# Patient Record
Sex: Female | Born: 1975 | Race: White | Hispanic: No | Marital: Married | Smoking: Never smoker
Health system: Southern US, Community
[De-identification: ages and names within clinical notes are randomized; demographics above are authoritative.]

## PROBLEM LIST (undated history)

## (undated) DIAGNOSIS — E039 Hypothyroidism, unspecified: Secondary | ICD-10-CM

## (undated) DIAGNOSIS — Z808 Family history of malignant neoplasm of other organs or systems: Secondary | ICD-10-CM

## (undated) DIAGNOSIS — Z803 Family history of malignant neoplasm of breast: Secondary | ICD-10-CM

## (undated) DIAGNOSIS — Z800 Family history of malignant neoplasm of digestive organs: Secondary | ICD-10-CM

## (undated) DIAGNOSIS — D649 Anemia, unspecified: Secondary | ICD-10-CM

## (undated) HISTORY — DX: Family history of malignant neoplasm of breast: Z80.3

## (undated) HISTORY — DX: Hypothyroidism, unspecified: E03.9

## (undated) HISTORY — DX: Family history of malignant neoplasm of digestive organs: Z80.0

## (undated) HISTORY — DX: Family history of malignant neoplasm of other organs or systems: Z80.8

## (undated) HISTORY — DX: Anemia, unspecified: D64.9

---

## 2003-06-21 ENCOUNTER — Inpatient Hospital Stay (HOSPITAL_COMMUNITY): Admission: AD | Admit: 2003-06-21 | Discharge: 2003-06-21 | Payer: Self-pay | Admitting: *Deleted

## 2004-12-01 ENCOUNTER — Other Ambulatory Visit: Admission: RE | Admit: 2004-12-01 | Discharge: 2004-12-01 | Payer: Self-pay | Admitting: Obstetrics and Gynecology

## 2005-06-16 ENCOUNTER — Inpatient Hospital Stay (HOSPITAL_COMMUNITY): Admission: AD | Admit: 2005-06-16 | Discharge: 2005-06-18 | Payer: Self-pay | Admitting: Obstetrics and Gynecology

## 2005-08-03 ENCOUNTER — Other Ambulatory Visit: Admission: RE | Admit: 2005-08-03 | Discharge: 2005-08-03 | Payer: Self-pay | Admitting: Obstetrics and Gynecology

## 2008-04-03 ENCOUNTER — Ambulatory Visit (HOSPITAL_COMMUNITY): Admission: RE | Admit: 2008-04-03 | Discharge: 2008-04-03 | Payer: Self-pay | Admitting: Obstetrics and Gynecology

## 2008-04-03 IMAGING — RF DG HYSTEROGRAM
2 series · 2 of 2 positions shown · non-contrast
Comparison: none

CLINICAL DATA: Evaluate tubal patency

HYSTEROSALPINGOGRAM
TECHNIQUE: Hysterosalpingogram was performed by the ordering
physician under fluoroscopy.  Fluoroscopic images are submitted for
interpretation following the procedure.
Fluoroscopy time:  1.4 minutes

[Series 1: run · 1 of 1 slices shown (1 of 2)]
[im 1/1]
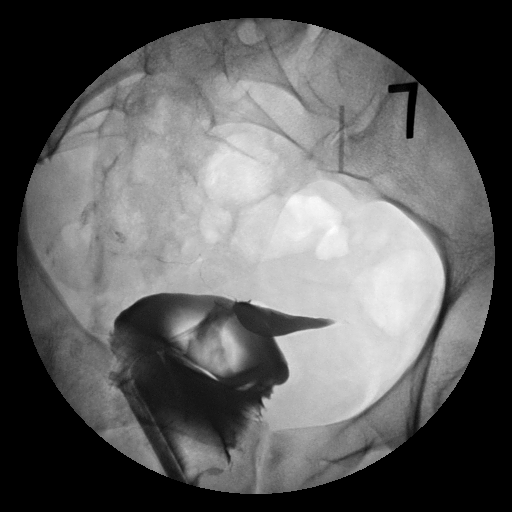

[Series 2: run · 1 of 1 slices shown (2 of 2)]
[im 1/1]
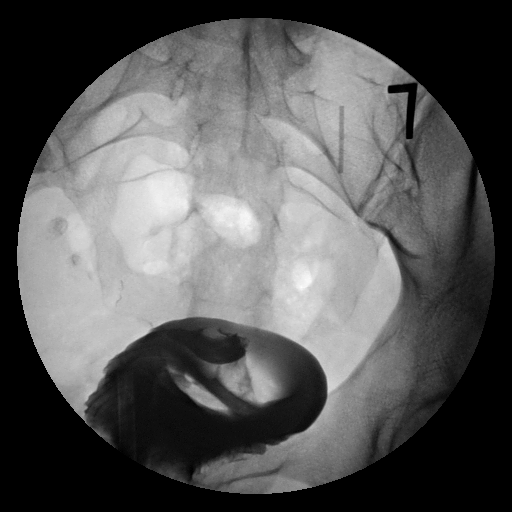

[2 of 2 positions shown; findings below may reference images not displayed]

FINDINGS: Two images are submitted for interpretation.  No
definite focal endometrial abnormalities are identified on the
provided images.  The right fallopian tube has a normal morphology.
A small amount of intraperitoneal spill is suggested on this side
although it is minimal in amount and precludes evaluation for the
possibility of peritubal or periovarian adhesions.  The left
fallopian tube is not seen past the level of the cornual-tubal
junction.  This may be the result of spasm, occlusion or possibly
an inadequate amount of pressure to fill the tube given the large
amount of contrast identified within the vaginal vault suggesting a
significant back leakage during injection.
IMPRESSION: No focal endometrial abnormality.  Probable right tubal patency.
Non-visualized left fallopian tube.  Please see above report for
complete discussion.

## 2008-04-23 ENCOUNTER — Encounter: Admission: RE | Admit: 2008-04-23 | Discharge: 2008-04-23 | Payer: Self-pay | Admitting: Obstetrics and Gynecology

## 2008-05-05 ENCOUNTER — Ambulatory Visit (HOSPITAL_COMMUNITY): Admission: RE | Admit: 2008-05-05 | Discharge: 2008-05-05 | Payer: Self-pay | Admitting: Obstetrics and Gynecology

## 2008-05-05 IMAGING — XA DG HYSTEROGRAM
1 series · 7 of 7 positions shown · non-contrast
Comparison: none

EXAMINATION:

LEFT FALLOPIAN TUBE RECANALIZATION
HYSTEROSALPINGOGRAM
CLINICAL DATA: 32-year-old female infertility evaluation, previous
HSG demonstrated left fallopian tubal occlusion.  Right fallopian
tube was visualized and patent at that time.

[Series 1000: run · 0.08mm/px · 7 of 7 slices shown]
[im 1/7]
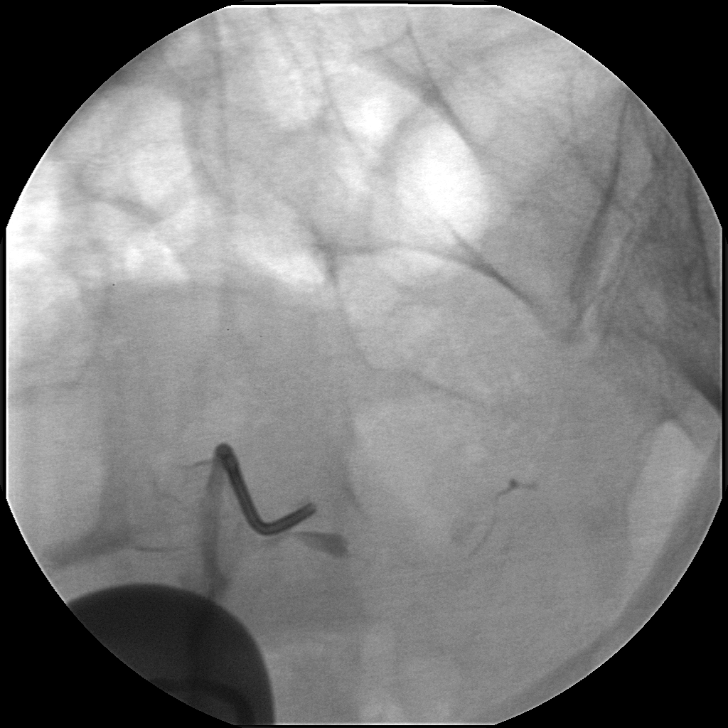
[im 2/7]
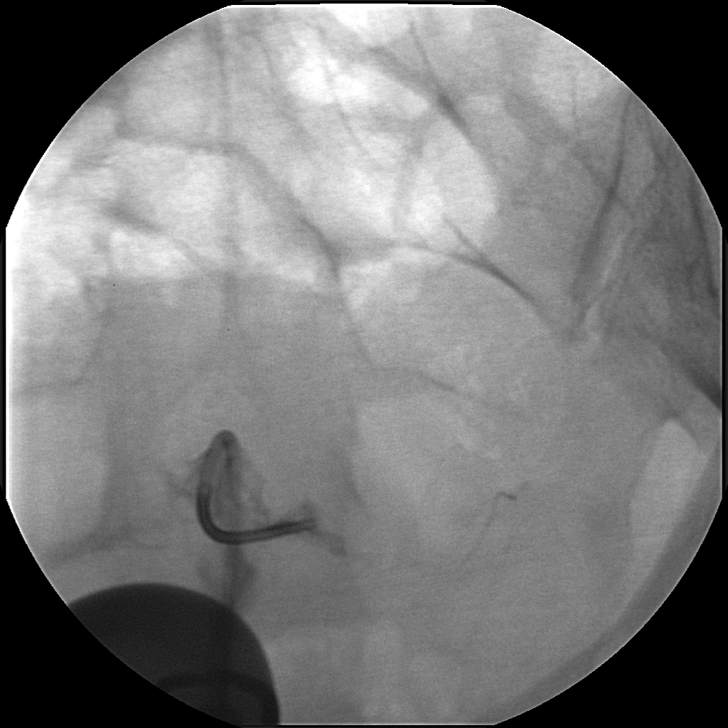
[im 3/7]
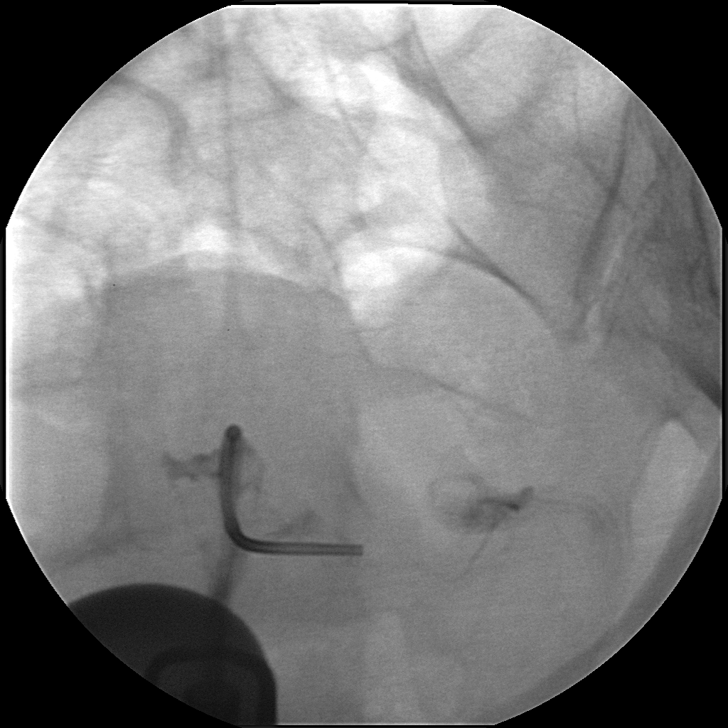
[im 4/7]
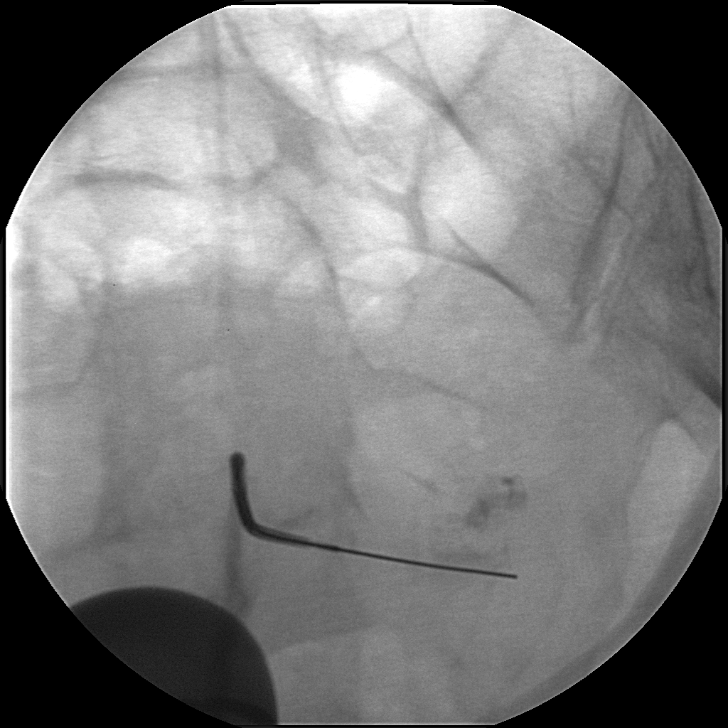
[im 5/7]
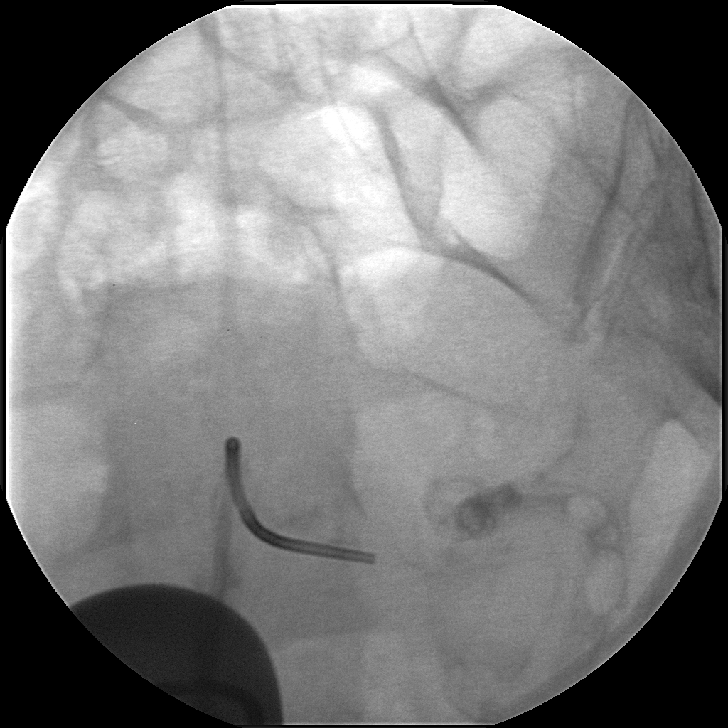
[im 6/7]
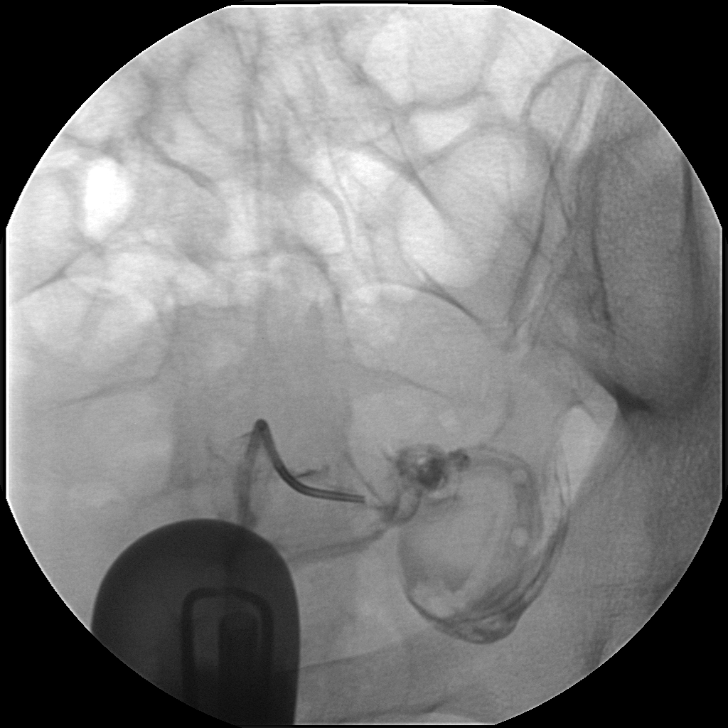
[im 7/7]
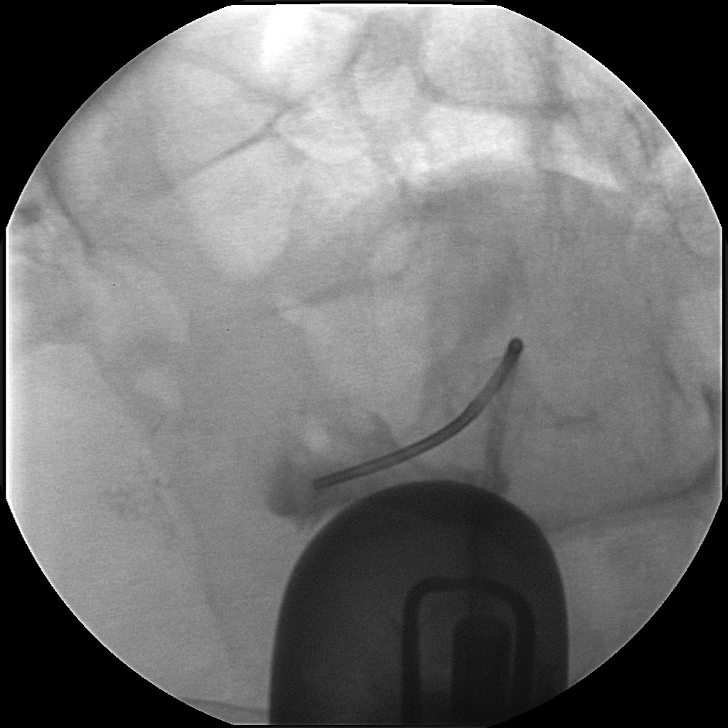

[7 of 7 positions shown; findings below may reference images not displayed]

Date:[DATE] [DATE]

Radiologist:DAIMUI.DAIMUI, M.D.

Medications:  1 gram Ancef  intravenous

Guidance:Fluoroscopic

Fluoroscopy time:13 minutes

Contrast volume:100 ml [JA]

Complications:No immediate

PROCEDURE/FINDINGS:

Informed consent was obtained from the patient following
explanation of the procedure, risks, benefits and alternatives.
The patient understands, agrees and consents for the procedure.
All questions were addressed.  A time out was performed.

The previous HSG from [DATE] [JA] was reviewed.

The perineal region was sterilely prepped and draped.  The speculum
was advanced into the vaginal canal and the cervix was secured.
The cervical os was prepped with betadine.  The fallopian tube
recanalization outer cannula was advanced into the cervical os and
secured with an external tenaculum.  A 5-French Kumpe catheter over
a guide wire was advanced through the endocervical canal  into the
intrauterine cavity.  Contrast injection was performed for a HSG.

The catheter and guidewire were manipulated to the left fallopian
tube orifice.  The micro catheter and micro guide wire were easily
advanced into the proximal one third of the left fallopian tube.
Repeat contrast injection confirms patency of the left fallopian
tube with peritoneal spillage identified.  Images were obtained for
documentation.

Attempts were made to reconfirm right fallopian tube patency.
However, because of angulation, and difficulty distending the
intrauterine cavity imaging of the right fallopian tube was very
limited.  There is faint visualization of contrast within the right
fallopian tube distally confirming patency.  The right fallopian
tube was better visualized on the [DATE] exam.

The patient tolerated the procedure well.  No immediate
complication.  Catheter, guide wires and speculum were removed.
IMPRESSION: Successful left fallopian tube recanalization confirming
patency with peritoneal spillage.
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
## 2011-03-08 LAB — CBC
MCV: 86.1
RDW: 13.5
WBC: 4.8

## 2011-03-08 LAB — HCG, SERUM, QUALITATIVE: Preg, Serum: NEGATIVE

## 2015-07-01 DIAGNOSIS — N920 Excessive and frequent menstruation with regular cycle: Secondary | ICD-10-CM | POA: Insufficient documentation

## 2015-07-01 DIAGNOSIS — D251 Intramural leiomyoma of uterus: Secondary | ICD-10-CM | POA: Insufficient documentation

## 2016-06-06 HISTORY — PX: UTERINE FIBROID SURGERY: SHX826

## 2018-11-08 ENCOUNTER — Other Ambulatory Visit: Payer: Self-pay | Admitting: Genetic Counselor

## 2018-11-08 DIAGNOSIS — Z803 Family history of malignant neoplasm of breast: Secondary | ICD-10-CM

## 2018-11-12 ENCOUNTER — Telehealth: Payer: Self-pay | Admitting: Genetic Counselor

## 2018-11-12 ENCOUNTER — Inpatient Hospital Stay: Payer: Self-pay

## 2018-11-12 NOTE — Telephone Encounter (Signed)
Called patient regarding upcoming Webex appointment, patient does not want this to be a virtual visit. Patient will need this to be a walk-in visit.

## 2018-11-13 ENCOUNTER — Other Ambulatory Visit: Payer: Self-pay

## 2018-11-13 ENCOUNTER — Inpatient Hospital Stay: Payer: BC Managed Care – PPO

## 2018-11-13 ENCOUNTER — Encounter: Payer: Self-pay | Admitting: Genetic Counselor

## 2018-11-13 ENCOUNTER — Inpatient Hospital Stay: Payer: BC Managed Care – PPO | Attending: Genetic Counselor | Admitting: Genetic Counselor

## 2018-11-13 DIAGNOSIS — Z800 Family history of malignant neoplasm of digestive organs: Secondary | ICD-10-CM

## 2018-11-13 DIAGNOSIS — Z7183 Encounter for nonprocreative genetic counseling: Secondary | ICD-10-CM

## 2018-11-13 DIAGNOSIS — Z808 Family history of malignant neoplasm of other organs or systems: Secondary | ICD-10-CM

## 2018-11-13 DIAGNOSIS — Z803 Family history of malignant neoplasm of breast: Secondary | ICD-10-CM

## 2018-11-13 NOTE — Progress Notes (Signed)
REFERRING PROVIDER: No referring provider defined for this encounter.  PRIMARY PROVIDER:  Jonathon Jordan, MD  PRIMARY REASON FOR VISIT:  1. Family history of breast cancer   2. Family history of colon cancer   3. Family history of melanoma      HISTORY OF PRESENT ILLNESS:   Suzanne Kerr, a 43 y.o. female, was seen for a [place] cancer genetics consultation at the request of Dr. No ref. provider found due to a family history of cancer and a known family mutation in [place].  Suzanne Kerr presents to clinic today to discuss the possibility of a hereditary predisposition to cancer, genetic testing, and to further clarify her future cancer risks, as well as potential cancer risks for family members.   Suzanne Kerr is a 43 y.o. female with no personal history of cancer.  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  CANCER HISTORY:   No history exists.     RISK FACTORS:  Menarche was at age 74-15.  First live birth at age 59.  OCP use for approximately 0 years.  Ovaries intact: yes.  Hysterectomy: no.  Menopausal status: premenopausal.  HRT use: 0 years. Colonoscopy: no; not examined. Mammogram within the last year: yes. Number of breast biopsies: 0. Up to date with pelvic exams: yes. Any excessive radiation exposure in the past: no  Past Medical History:  Diagnosis Date  . Family history of breast cancer   . Family history of colon cancer   . Family history of melanoma       Social History   Socioeconomic History  . Marital status: Married    Spouse name: Not on file  . Number of children: Not on file  . Years of education: Not on file  . Highest education level: Not on file  Occupational History  . Not on file  Social Needs  . Financial resource strain: Not on file  . Food insecurity:    Worry: Not on file    Inability: Not on file  . Transportation needs:    Medical: Not on file    Non-medical: Not on file  Tobacco Use  . Smoking status: Not on file   Substance and Sexual Activity  . Alcohol use: Not on file  . Drug use: Not on file  . Sexual activity: Not on file  Lifestyle  . Physical activity:    Days per week: Not on file    Minutes per session: Not on file  . Stress: Not on file  Relationships  . Social connections:    Talks on phone: Not on file    Gets together: Not on file    Attends religious service: Not on file    Active member of club or organization: Not on file    Attends meetings of clubs or organizations: Not on file    Relationship status: Not on file  Other Topics Concern  . Not on file  Social History Narrative  . Not on file     FAMILY HISTORY:  We obtained a detailed, 4-generation family history.  Significant diagnoses are listed below: Family History  Problem Relation Age of Onset  . Breast cancer Mother 27  . Heart disease Father   . Breast cancer Sister 60       PALB2+  . Colon cancer Sister 61  . Thyroid cancer Sister        early 32s  . Stroke Maternal Grandmother   . Melanoma Maternal  Grandfather   . Heart disease Paternal Grandfather   . Breast cancer Other        MGMs sister; post-menopausal cancer    The patient has one daughter who is cancer free. She has a brother who is cancer free.  Her sister had breast cancer at 38, colon cancer at 58 and probable thyroid cancer in her early 77's.  Both parents are deceased.  The patient's father died of heart disease.  He was an only child.  His parents are deceased.  His mother died at 1 from old age, and his father died of heart disease.  The patient's mother had breast cancer in her late 65's and died at 35 from a recurrence.  She has two brothers who are cancer free.  Her parents are deceased.  Her father had melanoma and her mother died of a stroke.  She had 8 sisters, one had breast cancer post-menopause.  Suzanne Kerr is aware of previous family history of genetic testing for hereditary cancer risks. Patient's maternal ancestors are of  [place] descent, and paternal ancestors are of [place] descent. There is no reported Ashkenazi Jewish ancestry. There is no known consanguinity.  GENETIC COUNSELING ASSESSMENT: Suzanne Kerr is a 43 y.o. female with a family history of cancer which is somewhat suggestive of a hereditary cancer syndrome and predisposition to cancer. We, therefore, discussed and recommended the following at today's visit.   DISCUSSION: We discussed that 5 - 10% of breast cancer is hereditary, with most cases associated with BRCA mutations.  Her sister had genetic testing for BRCA mutations when she was initially diagnosed and was negative.  There are other genes that can be associated with hereditary breast cancer syndromes.  These include PALB2, which her sister was found to have a pathogenic mutation in called c.3362del (p.Gly1121Valfs*3).   She also was found to have a variant of unknown significance (VUS) in WT1.  We discussed PALB2, the family history of colon cancer, and her reported CF mutation identified several years ago.  Clinical condition The risk of breast cancer in women with a single pathogenic PALB2 variant is 25-58% by age 64, with higher risks among those with a greater number of relatives with breast cancer (PMID: 83419622, 29798921, 19417408, 14481856). One study found the risk of developing contralateral breast cancer is approximately 10% within five years after the initial diagnosis of breast cancer among individuals with a pathogenic variant in [place] (PMID: 31497026).  For both men and women, there is also an increased risk for pancreatic cancer, however, specific risk figures are not yet established (PMID: 37858850, 27741287, 86767209). Additional data suggests an increased risk of ovarian cancer (PMID: 47096283, 66294765) and female breast cancer (PMID: 46503546, 56812751, 70017494), although this evidence is limited and emerging.  Inheritance Hereditary predisposition to cancer due to pathogenic variants  in the PALB2 gene has autosomal dominant inheritance. This means that Suzanne Kerr has a 50% chance of having the PALB2 mutation found in her sister. Once a pathogenic mutation is detected in an individual, it is possible to identify at-risk relatives who can pursue testing for this specific familial variant. Many cases are inherited from a parent, but some cases may occur spontaneously (i.e., an individual with a pathogenic variant who has parents who do not have it).  Individuals with a single pathogenic PALB2 variant are also carriers of autosomal recessive Fanconi anemia type N. Fanconi anemia is characterized by bone marrow failure with variable additional anomalies, which often include short stature, abnormal  skin pigmentation, abnormal thumbs, malformations of the skeletal and central nervous systems, and developmental delay (PMID: 9977414, 23953202). Risk of leukemia and early onset solid tumors is significantly elevated with this disorder (PMID: 33435686, 16837290, 21115520). For there to be a risk of Fanconi anemia in offspring, both the patient and their partner would each have to carry a pathogenic variant in [place]; in this case, the risk to have an affected child is 25%.  Colon Cancer:  Suzanne Kerr sister was diagnosed with colon cancer at age 71.  We discussed that individuals diagnosed under the age of 34 with colon cancer are at higher risk for having a hereditary colon cancer syndrome.  Suzanne Kerr sisters' genetic testing did not identify a hereditary mutation in a colon cancer gene.  This means that we do not know why her sister has colon cancer.  Colon cancer is not a hallmark cancer of PALB2 mutations, however, this does not mean that the mutation could not have contributed to her risk for cancer.  Based on the family history, Suzanne Kerr should start colonoscopies 10 years younger than her sister's age of onset.  This means that Suzanne Kerr should start now with colonoscopies, and be screened  every 5 years, rather than every 10.  We discussed that she could be seen by any of the gastroenterologists in town.  Cystic Fibrosis:   The CFTR gene is not considered a 'cancer gene', but it can increase the risk for pancreatitis, which does increase the risk for pancreatic cancer. Suzanne Kerr revealed that she had tested positive in the past for a CF mutation.  The CFTR gene is associated with autosomal recessive cystic fibrosis (MedGen UID: 80223). Other CFTR-related disorders include congenital bilateral absence of the vas deferens (CBAVD; MedGen UID: 36122) and an increased risk for pancreatitis (PMID: 44975300, 51102111).  Cystic fibrosis (CF) is characterized by the buildup of mucus that can damage the digestive, respiratory, and reproductive systems (PMID: 73567014). The severity of CF ranges from mild to severe and there is significant variability in presentation, even within a family (PMID: 10301314, 38887579, 72820601). Symptoms include chronic cough, recurring chest colds, wheezing, shortness of breath, recurring sinus infections, excessive sweating, and poor growth. In classic CF, symptoms begin in early childhood, with average life expectancy in the 41's. As the disease progresses, mucus buildup and recurring infections lead to irreparable lung damage and pulmonary complications, which are considered to be the primary cause of mortality (Cystic Fibrosis Foundation. http://www.anderson-foster.com/. ?Accessed July 04, 2016). In addition to pulmonary disease, around 80-90% of individuals with classic CF have pancreatic insufficiency, which causes digestive problems, malnutrition, and poor growth (PMID: 56153794). Other symptoms include meconium ileus at birth (~20% of affected newborns), diabetes (20% of adolescents; 40-50% of adults), liver disease (5-10% of affected individuals in the first decade of life), and female infertility (>95% of affected males; PMID: [phone])[phone], 40370964, 38381840).  In mild  cases of CF, symptoms may present later in life, with less severity, and may not impact life expectancy (PMID: 37543606).  CBAVD is associated with female infertility and refers to bilateral hypoplasia or aplasia of the vas deferens and seminal vesicles. Men are typically azoospermic but have normal testicular function and spermatogenesis, and are therefore able to have biological children with assisted reproductive technology (PMID: 77034035).  Hereditary pancreatitis is characterized by recurring episodes of acute inflammation of the pancreas beginning in childhood or adolescence and leading to chronic pancreatitis. The clinical presentation is highly variable and may include chronic abdominal pain,  impairment of endocrine and exocrine pancreatic function, nausea and vomiting, maldigestion, and diabetes (KGUR:42706237). Pathogenic variants in CFTR may confer an approximately four- to ten-fold increased risk for chronic pancreatitis in heterozygous carriers. Other genetic and environmental risk factors are also known to modify this risk (PMID: 62831517, 6160737, 10626948, 54627035, 00938182, 99371696, 78938101). Chronic pancreatitis is a risk factor for pancreatic cancer (PMID: 75102585).  Management for CFTR carriers Treatment for hereditary pancreatitis primarily focuses on pain management, maldigestion, and monitoring for diabetes and pancreatic cancer (PMID: 27782423). Adhering to a low-fat diet, eating small and frequent meals, taking enzyme supplements, keeping hydrated, and avoiding alcohol and smoking are advised (PMID: 53614431). In some cases, surgery (including total pancreatectomy with islet autotransplantation) may be warranted (PMID: 54008676). Specific recommendations exist for the treatment of pancreatic diabetes (PMID: 19509326).  We discussed that testing is beneficial for several reasons including knowing how to follow individuals after completing their treatment, identifying whether  potential treatment options such as PARP inhibitors would be beneficial, and understand if other family members could be at risk for cancer and allow them to undergo genetic testing.   FAMILY MEMBERS: It is important that all of Suzanne Kerr relatives (both men and women) know of the presence of these gene mutations. Site-specific genetic testing can sort out who in the family is at risk and who is not. Knowing if a PALB2 or CFTR pathogenic variant is present is advantageous. At-risk relatives can be identified, enabling pursuit of a diagnostic evaluation. Further, the available information regarding hereditary cancer susceptibility genes is constantly evolving and more clinically relevant data regarding PALB2 are likely to become available in the near future. Awareness of this cancer predisposition encourages patients and their providers to inform at-risk family members, to diligently follow recommended screening protocols, and to be vigilant in maintaining close and regular contact with their local genetics clinic in anticipation of new information.  Additionally, individuals with a pathogenic variant in PALB2 are carriers of Fanconi anemia. Fanconi anemia is an autosomal recessive disorder that is characterized by bone marrow failure and variable presentation of anomalies, including short stature, abnormal skin pigmentation, abnormal thumbs, malformations of the skeletal and central nervous systems, and developmental delay. Risks for leukemia and early onset solid tumors are significantly elevated. For there to be a risk of Fanconi anemia in offspring, both parents would each have to have a single pathogenic variant in [place]; in such a case, the risk of having an affected child is 25%.  Carriers of CFTR mutations are also at an increased risk of having a child affected with CF. For those of reproductive age, partners of known carriers should consider genetic testing to determine their reproductive risk.  Around 1 in 13 people in the [place] are carriers of CF ([place]. http://www.anderson-foster.com/. ?Accessed July 04, 2016); however, this risk varies slightly based on ethnicity. Reproductive options are available for at-risk carrier couples, including prenatal diagnosis, IVF with preimplantation genetic diagnosis (PGD), gamete donation, and adoption. Additionally, newborn screening for CF is available in every state.  Lastly, we reviewed the characteristics, features and inheritance patterns of hereditary cancer syndromes. We also discussed genetic testing, including the appropriate family members to test, the process of testing, insurance coverage and turn-around-time for results. We discussed the implications of a negative, positive and/or variant of uncertain significant result. We recommended Suzanne Kerr pursue genetic testing for the targeted PALB2 pathogenic mutation and WT1 VUS.  IF these are negative, we will revisit on whether Suzanne Kerr will  proceed with further testing.   PLAN: After considering the risks, benefits, and limitations, Suzanne Kerr provided informed consent to pursue genetic testing and the blood sample was sent to [facility] for analysis of the PALB2 and WT1 single site testing. Results should be available within approximately 2-3 weeks' time, at which point they will be disclosed by telephone to Suzanne Kerr, as will any additional recommendations warranted by these results. Suzanne Kerr will receive a summary of her genetic counseling visit and a copy of her results once available. This information will also be available in Epic.   Lastly, we encouraged Ms. Hillmann to remain in contact with cancer genetics annually so that we can continuously update the family history and inform her of any changes in cancer genetics and testing that may be of benefit for this family.   Suzanne Kerr questions were answered to her satisfaction today. Our contact information was provided should additional  questions or concerns arise. Thank you for the referral and allowing [place] to share in the care of your patient.   Olimpia Tinch P. Florene Glen, [place], [facility] Certified Genetic Counselor [email or web address] phone: [phone]  The patient was seen for a total of 60 minutes in face-to-face genetic counseling.  This patient was discussed with Drs. Magrinat, Lindi Adie and/or Burr Medico who agrees with the above.    _______________________________________________________________________ For Office Staff:  Number of people involved in session: 1 Was an Intern/ student involved with case: no

## 2018-11-20 ENCOUNTER — Encounter: Payer: Self-pay | Admitting: Genetic Counselor

## 2018-11-20 ENCOUNTER — Telehealth: Payer: Self-pay | Admitting: Genetic Counselor

## 2018-11-20 DIAGNOSIS — Z1379 Encounter for other screening for genetic and chromosomal anomalies: Secondary | ICD-10-CM | POA: Insufficient documentation

## 2018-11-20 NOTE — Telephone Encounter (Signed)
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  Discussed that we would like to bring her back in to discuss - either via WebEx or in person.  She will call me back when she gets home to set up an appointment.

## 2018-11-30 ENCOUNTER — Telehealth: Payer: Self-pay | Admitting: Genetic Counselor

## 2018-11-30 NOTE — Telephone Encounter (Signed)
LM on VM asking if patient had any questions from our results conversation the other day.  I had given her the positive test results, and she was not in a place to ask questions.  She indicated she would call back, but have not heard from her.  I left CB number.

## 2018-12-03 ENCOUNTER — Other Ambulatory Visit: Payer: Self-pay

## 2018-12-24 ENCOUNTER — Telehealth: Payer: Self-pay | Admitting: Genetic Counselor

## 2018-12-24 NOTE — Telephone Encounter (Signed)
LM on VM that I was calling to follow up on our discussion a month or so ago.  I asked that she please call back and I left my phone number.

## 2018-12-25 ENCOUNTER — Ambulatory Visit: Payer: Self-pay | Admitting: Genetic Counselor

## 2018-12-25 DIAGNOSIS — Z1379 Encounter for other screening for genetic and chromosomal anomalies: Secondary | ICD-10-CM

## 2018-12-25 NOTE — Progress Notes (Signed)
HPI:  [name] was previously seen in the [facility] due to a family history of breast cancer, a known familial mutation in [place], and concerns regarding a hereditary predisposition to cancer. Please refer to our prior cancer genetics clinic note for more information regarding our discussion, assessment and recommendations, at the time. [name] recent genetic test results were disclosed to her, as were recommendations warranted by these results. These results and recommendations are discussed in more detail below.  CANCER HISTORY:  Oncology History   No history exists.    FAMILY HISTORY:  We obtained a detailed, 4-generation family history.  Significant diagnoses are listed below: Family History  Problem Relation Age of Onset  . Breast cancer Mother 31  . Heart disease Father   . Breast cancer Sister 21       PALB2+  . Colon cancer Sister 76  . Thyroid cancer Sister        early 76s  . Stroke Maternal Grandmother   . Melanoma Maternal Grandfather   . Heart disease Paternal Grandfather   . Breast cancer Other        MGMs sister; post-menopausal cancer    The patient has one daughter who is cancer free. She has a brother who is cancer free.  Her sister had breast cancer at 65, colon cancer at 29 and probable thyroid cancer in her early 64's.  Both parents are deceased.  The patient's father died of heart disease.  He was an only child.  His parents are deceased.  His mother died at 54 from old age, and his father died of heart disease.  The patient's mother had breast cancer in her late 63's and died at 27 from a recurrence.  She has two brothers who are cancer free.  Her parents are deceased.  Her father had melanoma and her mother died of a stroke.  She had 8 sisters, one had breast cancer post-menopause.  [name] is aware of previous family history of genetic testing for hereditary cancer risks. Patient's maternal ancestors are of [place] descent, and  paternal ancestors are of [place] descent. There is no reported Ashkenazi Jewish ancestry. There is no known consanguinity.    GENETIC TEST RESULTS: We recommended 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  The WT1 VUS was not identified.   The test report has been scanned into EPIC and is located under the Molecular Pathology section of the Results Review tab.  A portion of the result report is included below for reference.     This test result was called out to [name], but she was not in a place to discuss the result further.  We have tried reaching out several times to discuss the result and offer genetic counseling about medical management, but we have been unsuccessful.  [name] has been determined to be at high risk for breast cancer based on this genetic test result.  Therefore, we recommend that annual screening with mammography and breast MRI.  We feel that it is reasonable for [name] to be followed by a high-risk breast cancer clinic; in addition to a yearly mammogram and physical exam by a healthcare provider.  If you would like to refer her to  the [facility]) High Risk breast clinic, you may refer her to [facility] and indicate the referral to be for High risk breast clinic.   FAMILY MEMBERS: It is important that all of [name] relatives (both men and women) know of the presence of this gene mutation. Site-specific genetic testing can sort out who in the family is at risk and who is not.   Ms Suzanne Kerr's daughter has a 50% chance to have inherited this mutation. However, she is relatively young and this will not be of any consequence to her for several years. We do not test children because  there is no risk to them until they are adults. We recommend they have genetic counseling and testing by the time they are in their early 20's.    [name]'s brother also has a 50% chance to have inherited this mutation. We recommend he have genetic testing for this same mutation, as identifying the presence of this mutation would allow him to also take advantage of risk-reducing measures.   FOLLOW-UP: We understand that learning about hereditary pathogenic variants can be upsetting.  If [name] would like to discuss this in the future, we would be happy to see her at that time.  Our contact number was provided. [name] questions were answered to her satisfaction, and she knows she is welcome to call us at anytime with additional questions or concerns.   Suzanne Kerr, [place], [facility] Licensed, Certified Genetic Counselor [email or web address]

## 2019-08-22 ENCOUNTER — Inpatient Hospital Stay: Payer: BC Managed Care – PPO | Attending: Genetic Counselor | Admitting: Genetic Counselor

## 2019-08-22 DIAGNOSIS — Z1379 Encounter for other screening for genetic and chromosomal anomalies: Secondary | ICD-10-CM

## 2019-08-22 NOTE — Progress Notes (Signed)
GENETIC TEST RESULTS   Patient Name: Suzanne Kerr Patient Age: 44 y.o. Encounter Date: 08/22/2019  Referring Provider: Ardelle Balls, MD    [name] was seen in the [facility] on August 22, 2019 due to a family history of cancer and concern regarding a hereditary predisposition to cancer in the family based on a known familial mutation in [place]. Please refer to the prior Genetics clinic note for more information regarding [name]'s medical and family histories and our assessment at the time.   FAMILY HISTORY:  We obtained a detailed, 4-generation family history.  Significant diagnoses are listed below: Family History  Problem Relation Age of Onset  . Breast cancer Mother 49  . Heart disease Father   . Breast cancer Sister 4       PALB2+  . Colon cancer Sister 96  . Thyroid cancer Sister        early 11s  . Stroke Maternal Grandmother   . Melanoma Maternal Grandfather   . Heart disease Paternal Grandfather   . Breast cancer Other        MGMs sister; post-menopausal cancer    The patient has one daughter who is cancer free. She has a brother who is cancer free.  Her sister had breast cancer at 15, colon cancer at 37 and probable thyroid cancer in her early 71's.  Both parents are deceased.  The patient's father died of heart disease.  He was an only child.  His parents are deceased.  His mother died at 61 from old age, and his father died of heart disease.  The patient's mother had breast cancer in her late 81's and died at 25 from a recurrence.  She has two brothers who are cancer free.  Her parents are deceased.  Her father had melanoma and her mother died of a stroke.  She had 8 sisters, one had breast cancer post-menopause.  Ms. [name] is aware of previous family history of genetic testing for hereditary cancer risks. Patient's maternal ancestors are of [place] descent, and paternal ancestors are of [place] descent. There is no reported Ashkenazi Jewish  ancestry. There is no known consanguinity.    GENETIC TESTING: At the time of [name]'s visit, we recommended she pursue genetic testing for the specific PALB2 mutation that was identified in the family, as well as the VUS found in the [place] gene. The genetic testing (November 19, 2018) through the [place] and WT1 single gene testing offered by [facility] identified a single, heterozygous pathogenic gene mutation called PALB2 c.3362del.  The VUS in WT1 was not identified.   Clinical condition The risk of breast cancer in women with a single pathogenic PALB2 variant is 33-58% by age 13, with higher risks among those with a greater number of relatives with breast cancer (PMID: 79480165, 53748270, 78675449, 20100712). One study found the risk of developing contralateral breast cancer is approximately 10% within five years after the initial diagnosis of breast cancer among individuals with a pathogenic variant in [place] (PMID: 19758832).  For both men and women, there is also an increased risk for pancreatic cancer, however, specific risk figures are not yet established (PMID: 54982641, 58309407, 68088110). Additional data suggests an increased risk of ovarian cancer (PMID: 31594585, 92924462) and female breast cancer (PMID: 86381771, 16579038, 33383291), although this evidence is limited and emerging.  Gene information PALB2 is a tumor-suppressor gene, meaning its function is to help control the rate of growth and cell division in the body. The  protein product plays a critical role in homologous recombination repair (HRR) through its ability to recruit BRCA2 and RAD51 to DNA breaks (Uniprot: [email or web address]_HUMAN,Q86YC2 [email or web address].tn. Accessed January 2017). If there is a pathogenic variant in this gene that prevents it from functioning normally, the risk of developing certain types of cancers may be increased.  Inheritance Hereditary predisposition to cancer due to pathogenic variants in the PALB2  gene has autosomal dominant inheritance. This means that an individual with a pathogenic variant has a 50% chance of passing the condition on to their offspring. Once a pathogenic mutation is detected in an individual, it is possible to identify at-risk relatives who can pursue testing for this specific familial variant. Many cases are inherited from a parent, but some cases may occur spontaneously (i.e., an individual with a pathogenic variant who has parents who do not have it).  Individuals with a single pathogenic PALB2 variant are also carriers of autosomal recessive Fanconi anemia type N. Fanconi anemia is characterized by bone marrow failure with variable additional anomalies, which often include short stature, abnormal skin pigmentation, abnormal thumbs, malformations of the skeletal and central nervous systems, and developmental delay (PMID: 4401027, 25366440). Risk of leukemia and early onset solid tumors is significantly elevated with this disorder (PMID: 34742595, 63875643, 32951884). For there to be a risk of Fanconi anemia in offspring, both the patient and their partner would each have to carry a pathogenic variant in [place]; in this case, the risk to have an affected child is 25%.  MEDICAL MANAGEMENT: The [place] (NCCN) has published screening and surveillance guidelines for women with a single pathogenic variant in [place] (NCCN. Genetic/Familial High-Risk Assessment: Breast and Ovarian. Version 1.2018):  - Annual mammography with consideration of tomosynthesis beginning at age 69 . Consider annual breast MRI with contrast starting at age 87, with modification as appropriate based on family history . Prophylactic risk-reducing mastectomy: consider based on family history (evidence insufficient; manage based on family history)  - NCCN cites insufficient evidence to warrant screening for ovarian cancer and suggests screening for pancreatic cancer when there is a  family history (NCCN. Genetic/Familial High-Risk Assessment: Breast and Ovarian. Version 1.2021). In contrast, the [employer] of Gastroenterology Clinical Guidelines recommend pancreatic cancer screening in PALB2 carriers be limited to those with a first- or second-degree relative affected with pancreatic cancer. Ideally, screening should be performed in experienced centers utilizing a multidisciplinary approach under research conditions. Recommended screening includes annual endoscopic ultrasound and/or MRI of the pancreas starting at age 70 or 65 years younger than the earliest age of pancreatic cancer diagnosis in the family (PMID: 16606301).  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  The CFTR gene is associated with autosomal recessive cystic fibrosis (MedGen UID: 60109). Other CFTR-related disorders include congenital bilateral absence of the vas deferens (CBAVD; MedGen UID: 32355) and an increased risk for pancreatitis (PMID: 73220254, 27062376).  We focused our discussion on the increased risk for pancreatitis.    Hereditary pancreatitis is characterized by recurring episodes of acute inflammation of the pancreas beginning in childhood or adolescence and leading to chronic pancreatitis. The clinical presentation is highly variable and may include chronic abdominal pain, impairment of endocrine and exocrine pancreatic function, nausea and vomiting, maldigestion, and diabetes (EGBT:51761607). Pathogenic variants in CFTR may confer an approximately four- to ten-fold increased risk for chronic pancreatitis in heterozygous carriers. Other genetic and environmental risk factors are also known to modify this risk (PMID: 37106269, 4854627, 03500938, 18299371, 69678938, 10175102, 58527782).  Chronic pancreatitis is a risk factor for pancreatic cancer (PMID: 13244010).  It is unclear how the CFTR and PALB2 risks will influence the pancreatic cancer risk.  We discussed having a discussion with  the pancreatic cancer screening program.  I will make a referral to this program.  Overall cancer risk assessment incorporates additional factors including personal medical history, family history, and any available genetic information that may result in a personalized plan for cancer prevention and surveillance.  FAMILY MEMBERS: It is important that all of Ms. Fontenot's relatives (both men and women) know of the presence of this gene mutation. Site-specific genetic testing can sort out who in the family is at risk and who is not. Knowing if a PALB2 pathogenic variant is present is advantageous. At-risk relatives can be identified, enabling pursuit of a diagnostic evaluation. Further, the available information regarding hereditary cancer susceptibility genes is constantly evolving and more clinically relevant data regarding PALB2 are likely to become available in the near future. Awareness of this cancer predisposition encourages patients and their providers to inform at-risk family members, to diligently follow recommended screening protocols, and to be vigilant in maintaining close and regular contact with their local genetics clinic in anticipation of new information.  Ms. Asper daughter has a 50% chance to have inherited this mutation. However, they are relatively young and this will not be of any consequence to them for several years. We do not test children because there is no risk to them until they are adults. We recommend they have genetic counseling and testing by the time they are in their early 20s.    Ms. Lefeber's siblings have a 50% chance to have inherited this mutation. Her sister was the original person tested in the family and Ms. Binning indicates that her brother is currently undergoing testing.  We recommend they have genetic testing for this same mutation, as identifying the presence of this mutation would allow them to also take advantage of risk-reducing measures.   Additionally,  individuals with a pathogenic variant in PALB2 are carriers of Fanconi anemia. Fanconi anemia is an autosomal recessive disorder that is characterized by bone marrow failure and variable presentation of anomalies, including short stature, abnormal skin pigmentation, abnormal thumbs, malformations of the skeletal and central nervous systems, and developmental delay. Risks for leukemia and early onset solid tumors are significantly elevated. For there to be a risk of Fanconi anemia in offspring, both parents would each have to have a single pathogenic variant in [place]; in such a case, the risk of having an affected child is 25%.  SUPPORT AND RESOURCES: If Ms. Dawes is interested in PALB2-information and support, there are two groups, Facing Our Risk (www.facingourrisk.com) and Bright Pink (www.brightpink.org) which some people have found useful. They provide opportunities to speak with other individuals from high-risk families. To locate genetic counselors in other cities, visit the website of the [employer] (ArtistMovie.se) and Secretary/administrator for a Social worker by zip code.  Ms. Vanalstine will be referred to the high risk breast clinic at the [place] [facility] as well as the pancreatic cancer screening program based on the CF and PALB2 hereditary mutations. We encouraged Ms. Gehling to remain in contact with [place] on an annual basis so we can update her personal and family histories, and let her know of advances in cancer genetics that may benefit the family. Our contact number was provided. Ms. Zulueta questions were answered to her satisfaction today, and she knows she is welcome  to call anytime with additional questions.   Roma Kayser, [place], [facility]  Licensed, Certified Genetic Counselor [email or web address] phone: [phone]  The patient was seen for a total of 30 minutes in face-to-face genetic counseling.

## 2019-08-23 ENCOUNTER — Telehealth: Payer: Self-pay | Admitting: Genetic Counselor

## 2019-08-23 NOTE — Telephone Encounter (Signed)
lft vm to schedule for the high risk breast clinic

## 2019-08-26 ENCOUNTER — Telehealth: Payer: Self-pay | Admitting: Oncology

## 2019-08-26 ENCOUNTER — Encounter: Payer: Self-pay | Admitting: Gastroenterology

## 2019-08-26 NOTE — Progress Notes (Signed)
Spoke to patient. Appointment has been scheduled to discus PC screening with chronic pancreatitis risks related to the CF gene.

## 2019-08-26 NOTE — Telephone Encounter (Signed)
Suzanne Kerr returned my call to be seen in the high risk breast clinic on 4/14 at 4pm to see Dr. Jana Hakim.

## 2019-08-26 NOTE — Progress Notes (Signed)
LMOM for patient to call back.

## 2019-09-18 ENCOUNTER — Other Ambulatory Visit: Payer: Self-pay

## 2019-09-18 ENCOUNTER — Inpatient Hospital Stay: Payer: BC Managed Care – PPO | Attending: Genetic Counselor | Admitting: Oncology

## 2019-09-18 ENCOUNTER — Encounter: Payer: Self-pay | Admitting: Oncology

## 2019-09-18 DIAGNOSIS — Z1501 Genetic susceptibility to malignant neoplasm of breast: Secondary | ICD-10-CM

## 2019-09-18 DIAGNOSIS — Z9189 Other specified personal risk factors, not elsewhere classified: Secondary | ICD-10-CM | POA: Diagnosis not present

## 2019-09-18 DIAGNOSIS — Z800 Family history of malignant neoplasm of digestive organs: Secondary | ICD-10-CM | POA: Insufficient documentation

## 2019-09-18 DIAGNOSIS — Z141 Cystic fibrosis carrier: Secondary | ICD-10-CM | POA: Diagnosis not present

## 2019-09-18 DIAGNOSIS — Z1509 Genetic susceptibility to other malignant neoplasm: Secondary | ICD-10-CM

## 2019-09-18 DIAGNOSIS — Z9013 Acquired absence of bilateral breasts and nipples: Secondary | ICD-10-CM | POA: Insufficient documentation

## 2019-09-18 DIAGNOSIS — Z808 Family history of malignant neoplasm of other organs or systems: Secondary | ICD-10-CM | POA: Insufficient documentation

## 2019-09-18 DIAGNOSIS — E039 Hypothyroidism, unspecified: Secondary | ICD-10-CM | POA: Diagnosis not present

## 2019-09-18 DIAGNOSIS — Z1239 Encounter for other screening for malignant neoplasm of breast: Secondary | ICD-10-CM | POA: Insufficient documentation

## 2019-09-18 DIAGNOSIS — Z803 Family history of malignant neoplasm of breast: Secondary | ICD-10-CM | POA: Diagnosis not present

## 2019-09-18 DIAGNOSIS — Z1589 Genetic susceptibility to other disease: Secondary | ICD-10-CM

## 2019-09-18 MED ORDER — TAMOXIFEN CITRATE 20 MG PO TABS: 20.0000 mg | ORAL_TABLET | Freq: Every day | ORAL | 12 refills | Status: AC

## 2019-09-18 NOTE — Progress Notes (Signed)
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  Her family history includes breast cancer, colon cancer, and melanoma.  There is no family history of ovarian or pancreatic cancers.  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  She does not have a history of pancreatitis.  Suzanne Kerr underwent genetic counseling on 11/13/2018. Her results showed a positive PALB2 mutation but not the WT1 variant of uncertain significance noted in her sister.  She is here today to discuss her options given the increased risk of breast and other cancers associated with PALB2 mutations   INTERVAL HISTORY: Suzanne Kerr was evaluated in the high risk breast cancer clinic on 09/18/2019.    REVIEW OF SYSTEMS: Suzanne Kerr denies unusual headaches, visual changes, nausea, vomiting, stiff neck, dizziness, or gait imbalance. There has been no cough, phlegm production, or pleurisy, no chest pain or pressure, and no change in bowel or bladder habits. The patient denies fever, rash, bleeding, unexplained fatigue or unexplained weight loss. A detailed review of systems was otherwise entirely negative.   PAST MEDICAL HISTORY: History of Graves' disease, treated with radioactive iodine, currently hypothyroid  PAST SURGICAL HISTORY: Removal of a large fibroid tumor of the uterus  FAMILY HISTORY: Family History  Problem Relation Age of Onset  . Breast cancer Mother 42  . Heart disease Father   . Breast cancer Sister 71       PALB2+   . Colon cancer Sister 75  . Thyroid cancer Sister        early 17s  . Stroke Maternal Grandmother   . Melanoma Maternal Grandfather   . Heart disease Paternal Grandfather   . Breast cancer Other        MGMs sister; post-menopausal cancer  Thepatient'sfather died of heart disease. He was an only child. His parents are deceased. His mother died at 40 from old age, and his father died of heart disease. The patient's mother had breast cancer in her late40'sand died at 74 from a recurrence. She has two brothers who are cancer free. Her father had melanoma and her mother died of a stroke. The patient's maternal grandmother had 66 sisters, one had breast cancer post-menopause.  The patient has a sister with breast cancer at 67, colon cancer at 105 and probable thyroid cancer in her early 66's.  The patient has 1 brother who is cancer free.  GYNECOLOGIC HISTORY:  No LMP recorded. Menarche: 2-7 years old Age at first live birth: 44 years old [place] P1 LMP regular, ongoing Contraceptive: never used HRT n/a  Hysterectomy? no BSO? no   SOCIAL HISTORY: (updated 09/2019)  Suzanne Kerr is currently working as a [employer].  Her husband Suzanne Kerr is a Barista.  Their daughter, Suzanne Kerr, is 89 as of April 2021.   ADVANCED DIRECTIVES: In the absence of any documents to the contrary the patient's husband is her healthcare power of attorney   HEALTH MAINTENANCE: Social History   Tobacco Use  . Smoking status: Not on file  Substance Use Topics  . Alcohol use:  Not on file  . Drug use: Not on file     Colonoscopy:   PAP:   Bone density:   No Known Allergies  Current Outpatient Medications  Medication Sig Dispense Refill  . levothyroxine (SYNTHROID) 150 MCG tablet Take 150 mcg by mouth daily before breakfast.    . tamoxifen (NOLVADEX) 20 MG tablet Take 1 tablet (20 mg total) by mouth daily. 90 tablet 12   No current facility-administered medications for this visit.    OBJECTIVE:  Suzanne Kerr in no acute distress  Vitals:   09/18/19 1604  BP: (!) 119/58  Pulse: 74  Resp: 20  Temp: 98.9 F (37.2 C)  SpO2: 100%     Body mass index is 23.99 kg/m.   Wt Readings from Last 3 Encounters:  09/18/19 167 lb 3.2 oz (75.8 kg)      ECOG FS:0 - Asymptomatic  Ocular: Sclerae unicteric, pupils round and equal Ear-nose-throat: Wearing a mask Lymphatic: No cervical or supraclavicular adenopathy Lungs no rales or rhonchi Heart regular rate and rhythm Abd soft, nontender, positive bowel sounds MSK no focal spinal tenderness, no joint edema Neuro: non-focal, well-oriented, appropriate affect Breasts: No masses palpated in either breast, no skin or nipple changes of concern  LAB RESULTS:  CMP  No results found for: NA, K, CL, CO2, GLUCOSE, BUN, CREATININE, CALCIUM, PROT, ALBUMIN, AST, ALT, ALKPHOS, BILITOT, GFRNONAA, GFRAA  No results found for: TOTALPROTELP, ALBUMINELP, A1GS, A2GS, BETS, BETA2SER, GAMS, MSPIKE, SPEI  Lab Results  Component Value Date   WBC 4.8 05/05/2008   HGB 13.9 05/05/2008   HCT 41.6 05/05/2008   MCV 86.1 05/05/2008   PLT 223 05/05/2008    No results found for: LABCA2  No components found for: ASNKNL976  No results for input(s): INR in the last 168 hours.  No results found for: LABCA2  No results found for: BHA193  No results found for: XTK240  No results found for: XBD532  No results found for: CA2729  No components found for: HGQUANT  No results found for: CEA1 / No results found for: CEA1   No results found for: AFPTUMOR  No results found for: CHROMOGRNA  No results found for: KPAFRELGTCHN, LAMBDASER, KAPLAMBRATIO (kappa/lambda light chains)  No results found for: HGBA, HGBA2QUANT, HGBFQUANT, HGBSQUAN (Hemoglobinopathy evaluation)   No results found for: LDH  No results found for: IRON, TIBC, IRONPCTSAT (Iron and TIBC)  No results found for: FERRITIN  Urinalysis No results found for: COLORURINE, APPEARANCEUR,  LABSPEC, PHURINE, GLUCOSEU, HGBUR, BILIRUBINUR, KETONESUR, PROTEINUR, UROBILINOGEN, NITRITE, LEUKOCYTESUR   STUDIES: No results found.   ELIGIBLE FOR AVAILABLE RESEARCH PROTOCOL:AET  ASSESSMENT: 44 y.o. 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 and a family history of breast, and colon cancers and melanoma, no family history of ovarian or pancreatic cancers  (a) patient is also a cystic fibrosis carrier  (1) intensified screening for breast cancer:  (a) breast MRI yearly in April  (b) mammography yearly in October  (2) breast cancer risk reduction:  (a) considering starting tamoxifen  (b) considering bilateral mastectomies with reconstruction  (3) pancreatic cancer screening  (3) other cancer screening based on family history:  (a) yearly dermatology screen for melanoma  (b) colonoscopy every 5 years  PLAN: I met today with Suzanne Kerr and discussed her situation in detail.  We reviewed the basic information on genetic inheritance, the difference between having 1 versus both genes of a gene pair affected, and the fact that information on even relatively well-documented  genes such as PALB2 is preliminary and incomplete and more information will be coming in over the next several years.  We do know that the PALB2 gene increases breast cancer risk pretty much to the same extent as the BRCA2 gene--in fact the 2 genes work closely together.  Kamilia has 2 general strategies to deal with this proactively.  One is risk reduction.  The other one is intensified screening.  Risk reduction strategies include bilateral mastectomies and we discussed this together with the various options for reconstruction, which in her case most likely would be nipple sparing mastectomy with immediate reconstruction.  We discussed tamoxifen including the benefit (cutting the lifetime risk of breast cancer in half) and the possible toxicities side effects and complications.  We discussed  the possibility of goserelin or bilateral oophorectomies and also the numerous symptoms of menopause that would follow from that decision.  At this point as she is interested in considering tamoxifen, less interested in bilateral mastectomies at this point but this is something she may want to return to in the future.  In terms of intensified screening the standard is for yearly breast MRI alternating with yearly mammography and she is very interested in proceeding with that.  She will be meeting with Dr. Bryan Lemma to discuss pancreatic cancer screening.  I think she could discuss colonoscopy screening as well given the family history of colon cancer.  I would recommend colonoscopy every 5 years in her case to the age of 39.  Also given the family history of melanoma she would benefit from dermatologic screening and she would like me to set that up for her  Zanayah has a good understanding of the overall plan. She agrees with it. She will call with any problems that may develop before her next visit here.  Total encounter time 70 minutes.Suzanne Kerr C. , MD 09/18/2019 5:37 PM Medical Oncology and Hematology [facility] [place], [place] [address] Tel. [phone]    Fax. [phone]   This document serves as a record of services personally performed by Lurline Del, MD. It was created on his behalf by Wilburn Mylar, a trained medical scribe. The creation of this record is based on the scribe's personal observations and the provider's statements to them.   I, Lurline Del MD, have reviewed the above documentation for accuracy and completeness, and I agree with the above.    *Total Encounter Time as defined by the Centers for Medicare and Medicaid Services includes, in addition to the face-to-face time of a patient visit (documented in the note above) non-face-to-face time: obtaining and reviewing outside history, ordering and reviewing medications,  tests or procedures, care coordination (communications with other health care professionals or caregivers) and documentation in the medical record.

## 2019-09-19 ENCOUNTER — Telehealth: Payer: Self-pay | Admitting: Oncology

## 2019-09-19 NOTE — Telephone Encounter (Signed)
Scheduled appt per 4/14 los. Left voicemail with new appt details.

## 2019-09-24 ENCOUNTER — Encounter: Payer: Self-pay | Admitting: Gastroenterology

## 2019-09-24 ENCOUNTER — Ambulatory Visit: Payer: BC Managed Care – PPO | Admitting: Gastroenterology

## 2019-09-24 ENCOUNTER — Other Ambulatory Visit (INDEPENDENT_AMBULATORY_CARE_PROVIDER_SITE_OTHER): Payer: BC Managed Care – PPO

## 2019-09-24 ENCOUNTER — Other Ambulatory Visit: Payer: Self-pay

## 2019-09-24 VITALS — BP 118/80 | HR 75 | Temp 98.4°F | Ht 70.0 in | Wt 167.1 lb

## 2019-09-24 DIAGNOSIS — Z800 Family history of malignant neoplasm of digestive organs: Secondary | ICD-10-CM

## 2019-09-24 DIAGNOSIS — Z1589 Genetic susceptibility to other disease: Secondary | ICD-10-CM | POA: Diagnosis not present

## 2019-09-24 DIAGNOSIS — Z1289 Encounter for screening for malignant neoplasm of other sites: Secondary | ICD-10-CM

## 2019-09-24 DIAGNOSIS — Z808 Family history of malignant neoplasm of other organs or systems: Secondary | ICD-10-CM

## 2019-09-24 DIAGNOSIS — Z1501 Genetic susceptibility to malignant neoplasm of breast: Secondary | ICD-10-CM

## 2019-09-24 DIAGNOSIS — Z1509 Genetic susceptibility to other malignant neoplasm: Secondary | ICD-10-CM

## 2019-09-24 LAB — HEMOGLOBIN A1C: Hgb A1c MFr Bld: 5.4 % (ref 4.6–6.5)

## 2019-09-24 MED ORDER — CLENPIQ 10-3.5-12 MG-GM -GM/160ML PO SOLN: 1.0000 | Freq: Once | ORAL | 0 refills | Status: AC

## 2019-09-24 NOTE — Progress Notes (Signed)
Chief Complaint: Pancreatic cancer screening  Referring Provider:     Magrinat, Virgie Dad, MD; Roma Kayser   HPI:    Suzanne Kerr is a 44 y.o. female referred to the Gastroenterology Clinic for evaluation of Pancreatic Cancer screening and Colon cancer screening.  She has a known familial mutation in [place], with family history as outlined below.  She was recently seen by Dr. Jana [facility]ak[facility]/high risk breast cancer clinic on 09/18/2019 and by Roma Kayser in the [facility] on 08/22/2019.  Per review of notes from the high risk breast cancer clinic, she plans for intensified screening with yearly breast MRI in April and yearly mammography in October, and is planning on starting tamoxifen after her index breast MRI.  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  The VUS in WT1 was not identified (this is the genetic abnormality detected in her sister).   She additionally reports being a carrier for cystic fibrosis (as is her sister) diagnosed during pregnancy.  Otherwise, no prior history of pancreatitis.  She otherwise denies any GI symptoms, to include hematochezia, melena, abdominal pain, change in bowel habits, nausea, vomiting, fever, chills, weight loss, night sweats.  No previous EGD or colonoscopy.  Past Medical History:  Diagnosis Date  . Anemia   . Family history of breast cancer   . Family history of colon cancer   . Family history of melanoma   . Hypothyroidism      Past Surgical History:  Procedure Laterality Date  . UTERINE FIBROID SURGERY  2018   Family History   Problem Relation Age of Onset  . Breast cancer Mother 31  . Heart disease Father   . Colon polyps Father   . Breast cancer Sister 45       PALB2+  . Colon cancer Sister 69  . Thyroid cancer Sister        early 48s  . Colon polyps Sister   . Colon polyps Brother   . Stroke Maternal Grandmother   . Melanoma Maternal Grandfather   . Heart disease Paternal Grandfather   . Breast cancer Other        MGMs sister; post-menopausal cancer  . Esophageal cancer Neg Hx    Social History   Tobacco Use  . Smoking status: Never Smoker  . Smokeless tobacco: Never Used  Substance Use Topics  . Alcohol use: Not Currently  . Drug use: Never   Current Outpatient Medications  Medication Sig Dispense Refill  . levothyroxine (SYNTHROID) 150 MCG tablet Take 150 mcg by mouth daily before breakfast.    . tamoxifen (NOLVADEX) 20 MG tablet Take 1 tablet (20 mg total) by mouth daily. (Patient not taking: Reported on 09/24/2019) 90 tablet 12   No current facility-administered medications for this visit.   Allergies  Allergen Reactions  . Tranexamic Acid Rash    Stomach upset     Review of Systems: All systems reviewed and negative except where noted in HPI.     Physical Exam:    Wt Readings from Last 3 Encounters:  09/24/19 167 lb 2 oz (75.8 kg)  09/18/19 167 lb  3.2 oz (75.8 kg)    BP 118/80   Pulse 75   Temp 98.4 F (36.9 C)   Ht 5' 10"  (1.778 m)   Wt 167 lb 2 oz (75.8 kg)   BMI 23.98 kg/m  Constitutional:  Pleasant, in no acute distress. Psychiatric: Normal mood and affect. Behavior is normal. Neurological: Alert and oriented to person place and time. Skin: Skin is warm and dry. No rashes noted.   ASSESSMENT AND PLAN;   1) PALB2 Gene mutation carrier: 2) Cystic fibrosis gene carrier  44 year old female with a known mutation in the PALB2 gene, and recent genetic testing also identifying single, heterozygous pathogenic PALB2 mutation.  Additionally, she is a cystic fibrosis  gene carrier.  Otherwise, no known family history of pancreatic CA, and no personal history of pancreatitis.  We discussed the most recent [place] and AGA guidelines, screening for pancreatic cancer recommended in the following high risk individuals:  -Carriers of a germline BRCA2, BRCA1, p16, PALB2, ATM, MLH1, MSH2, or MSH6 (ie, HNPCC) gene mutation with at least one affected first-degree relative  -Additional discussed increased risks associated with CFTR patients having elevated risk chronic pancreatitis, which in turn increases risk of Pancreatic Cancer.  While she does not technically fit the PALB2 or CFTR mutation screening guidelines by themselves, she has elevated concerns about having two potential genetic predisposing markers, and therefore would like to proceed with PC screen protocol.  I agree, that taken alone, neither mutation with her currently known family history would technically fit PC screening, but having to genetic mutations that have been associated with Pancreatic cancer do raise the level of concern.  We discussed the pros/cons of screening protocols at length today, and arrived plan together:  Screening techniques include the following: -Baseline MRI/MRCP plus hemoglobin A1c -EUS in 6 months -Follow-up/surveillance phase: Alternate MRI/MRCP and EUS plus routine fasting blood glucose and/or hemoglobin A1c on a every 6 month schedule -If concerning features on imaging, check CA 19-9 -EUS with FNA for solid lesions ?5 mm, cystic lesions with worrisome features, or asymptomatic main pancreatic duct (MPD) strictures (with or without mass) -CT only for solid lesions, regardless of size, or asymptomatic MPD strictures of unknown etiology (without mass)  -Screening interval every 6 months as above in patients with no abnormalities/nonconcerning abnormalities -Screening interval every 3-6 months in patients with abnormalities that are NOT suspicious for  malignancy, but are concerning -EUS evaluation should be performed within 3-6 months for indeterminate lesions (abnormalities that are NOT suspicious for malignancy, but are concerning) -EUS evaluation should be performed within 3 months for high-risk lesions, if surgical resection is not planned. -Immediate surgical referral for abnormality suspicious for malignancy  -New-onset diabetes in a high-risk individual should lead to additional diagnostic studies or change in surveillance interval  -Participation in a registry or referral to a pancreas Center of Excellence should be pursued when possible for high-risk patients undergoing pancreas cancer screening -The target detectable pancreatic neoplasms are resectable stage I pancreatic ductal adenocarcinoma and high-risk precursor neoplasms, such as intraductal papillary mucinous neoplasms with high-grade dysplasia and some enlarged pancreatic intraepithelial neoplasias  -We discussed the limitations and potential risks of pancreas cancer screening prior to initiating any screening program, and the patient wishes to proceed with screening.  3) Family history of colon cancer -Sister diagnosed with colon cancer around age 59 -Colonoscopy now for initial, increased risk CRC screening.  If normal/unremarkable, repeat colonoscopy every 5 years  4) Family history of Melanoma: - Dermatology  referral   The indications, risks, and benefits of colonoscopy were explained to the patient in detail. Risks include but are not limited to bleeding, perforation, adverse reaction to medications, and cardiopulmonary compromise. Sequelae include but are not limited to the possibility of surgery, hospitalization, and mortality. The patient verbalized understanding and wished to proceed. All questions answered, referred to the scheduler and bowel prep ordered. Further recommendations pending results of the exam.      Lavena Bullion, DO, FACG  09/24/2019, 3:37  PM    Lurline Del, MD Roma Kayser

## 2019-09-24 NOTE — Patient Instructions (Signed)
If you are age 46 or older, your body mass index should be between 23-30. Your Body mass index is 23.98 kg/m. If this is out of the aforementioned range listed, please consider follow up with your Primary Care Provider.  If you are age 29 or younger, your body mass index should be between 19-25. Your Body mass index is 23.98 kg/m. If this is out of the aformentioned range listed, please consider follow up with your Primary Care Provider.   You have been scheduled for an MRI at [facility] on 10/08/19. Your appointment time is 6:30pm. Please arrive 15 minutes prior to your appointment time for registration purposes. Please make certain not to have anything to eat or drink 6 hours prior to your test. In addition, if you have any metal in your body, have a pacemaker or defibrillator, please be sure to let your ordering physician know. This test typically takes 45 minutes to 1 hour to complete. Should you need to reschedule, please call [phone] to do so.  You have been referred to Dermatology, you will be contacted with this appointment information.   Please go to the lab at [facility] ([place] You will need to go to level "B", you do not need an appointment for this. Hours available are 7:30 am - 4:30 pm.   It has been recommended to you by your physician that you have a(n) Colonoscopy completed. Per your request, we did not schedule the procedure(s) today. Please contact our office at [phone] should you decide to have the procedure completed. You will be scheduled for a pre-visit and procedure at that time.   We have sent the following medications to your pharmacy for you to pick up at your convenience: Clenpiq  It was a pleasure to see you today!  Vito Cirigliano, D.O.

## 2019-10-08 ENCOUNTER — Other Ambulatory Visit: Payer: Self-pay | Admitting: Gastroenterology

## 2019-10-08 ENCOUNTER — Other Ambulatory Visit: Payer: Self-pay

## 2019-10-08 ENCOUNTER — Ambulatory Visit (HOSPITAL_COMMUNITY)
Admission: RE | Admit: 2019-10-08 | Discharge: 2019-10-08 | Disposition: A | Discharge status: Home / Self Care | Payer: BC Managed Care – PPO | Source: Ambulatory Visit | Attending: Gastroenterology | Admitting: Gastroenterology

## 2019-10-08 DIAGNOSIS — Z808 Family history of malignant neoplasm of other organs or systems: Secondary | ICD-10-CM | POA: Insufficient documentation

## 2019-10-08 DIAGNOSIS — Z1289 Encounter for screening for malignant neoplasm of other sites: Secondary | ICD-10-CM | POA: Insufficient documentation

## 2019-10-08 DIAGNOSIS — Z800 Family history of malignant neoplasm of digestive organs: Secondary | ICD-10-CM

## 2019-10-08 IMAGING — MR MR ABDOMEN WO/W CM MRCP
18 of 20 series · 45 of 48 positions shown · IV contrast (gadavist)
Comparison: None.

CLINICAL DATA: 43-year-old female with genetic predisposition to
colon cancer pancreatic cancer.

EXAM:
MRI ABDOMEN WITHOUT AND WITH CONTRAST (INCLUDING MRCP)
TECHNIQUE: Multiplanar multisequence MR imaging of the abdomen was performed
both before and after the administration of intravenous contrast.
Heavily T2-weighted images of the biliary and pancreatic ducts were
obtained, and three-dimensional MRCP images were rendered by post
processing.
CONTRAST:  7mL GADAVIST GADOBUTROL 1 MMOL/ML IV SOLN

[Series 4: bSSFP · coronal · 6.0mm · 0.74mm/px · 1 of 35 slices shown]
[im 1/35]
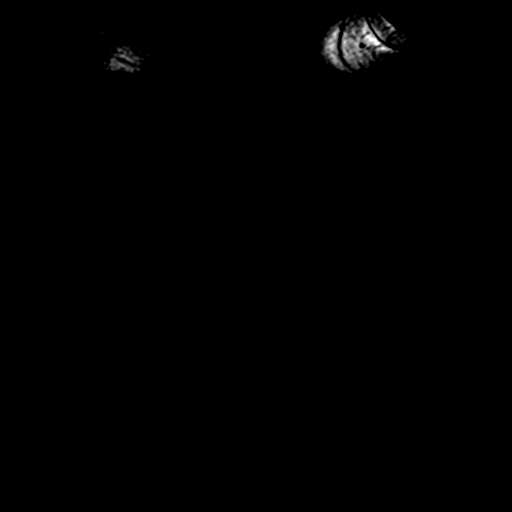

[Series 5: T2 fat-sat · axial · 6.0mm · 1.25mm/px · z∈[-26,+255]mm · 2 of 40 slices shown]
[im 1/40]
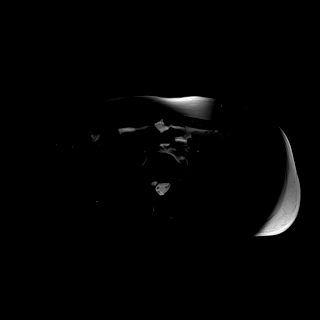
[im 40/40]
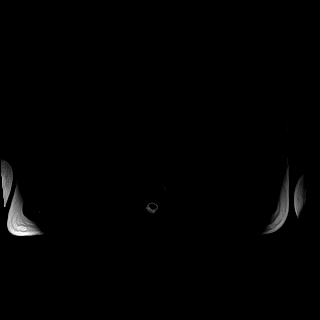

[Series 8: DWI · axial · 6.0mm · 1.49mm/px · z∈[+41,+293]mm · 3 of 72 slices shown (1 of 2)]
[im 1/72]
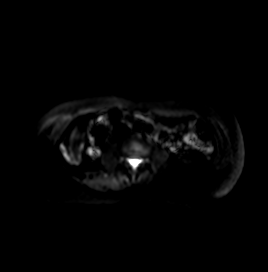
[im 36/72]
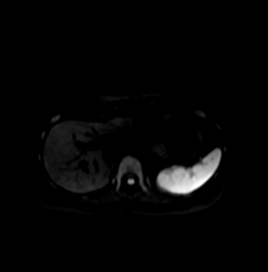
[im 72/72]
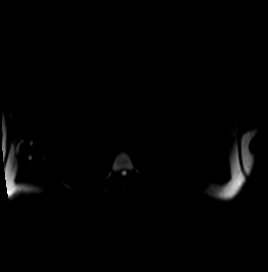

[Series 9: DWI · axial · 6.0mm · 1.49mm/px · 1 of 36 slices shown (2 of 2)]
[im 1/36]
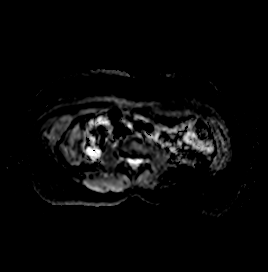

[Series 10: T1 · axial · 3.0mm · 1.25mm/px · z∈[+31,+268]mm · 3 of 80 slices shown (1 of 2)]
[im 1/80]
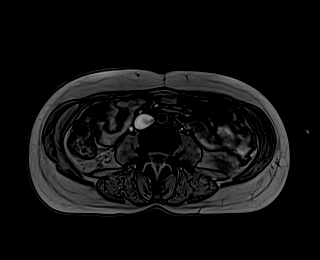
[im 40/80]
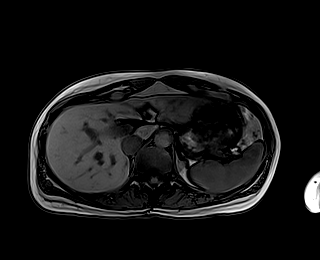
[im 80/80]
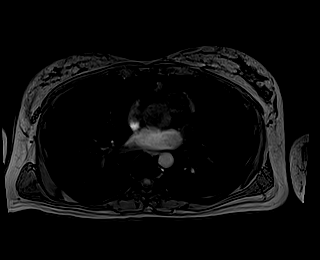

[Series 11: T1 · axial · 3.0mm · 1.25mm/px · z∈[+31,+268]mm · 3 of 80 slices shown (2 of 2)]
[im 1/80]
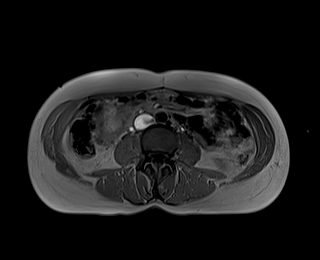
[im 40/80]
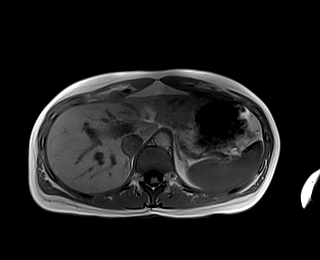
[im 80/80]
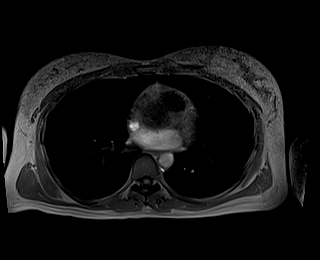

[Series 12: cor obl thk · sagittal · 50.0mm · 0.78mm/px · 1 of 9 slices shown]
[im 1/9]
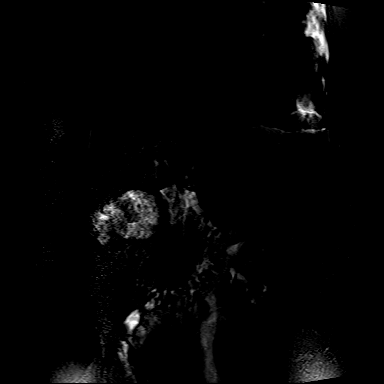

[Series 13: T2 · axial · 6.0mm · 1.56mm/px · 1 of 34 slices shown]
[im 1/34]
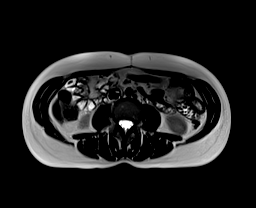

[Series 15: T1 dynamic · axial · 3.0mm · 1.25mm/px · z∈[+32,+269]mm · 3 of 80 slices shown (1 of 10)]
[im 1/80]
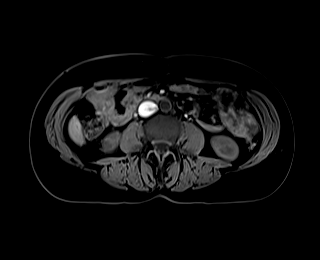
[im 40/80]
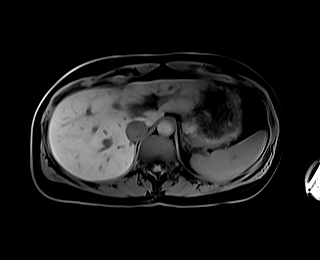
[im 80/80]
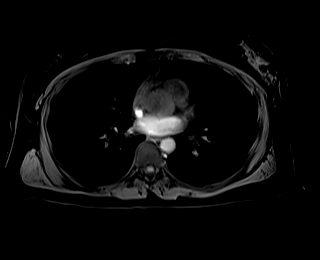

[Series 19: T1 dynamic · axial · 3.0mm · 1.25mm/px · z∈[+32,+269]mm · 3 of 80 slices shown (2 of 10)]
[im 1/80]
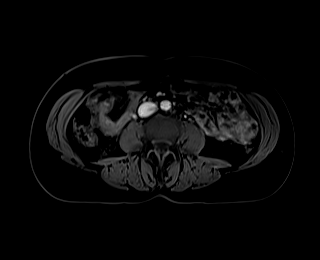
[im 40/80]
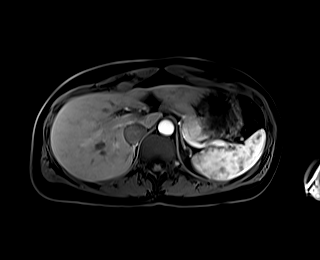
[im 80/80]
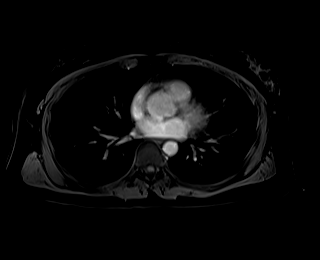

[Series 20: T1 dynamic · axial · 3.0mm · 1.25mm/px · z∈[+32,+269]mm · 3 of 80 slices shown (3 of 10)]
[im 1/80]
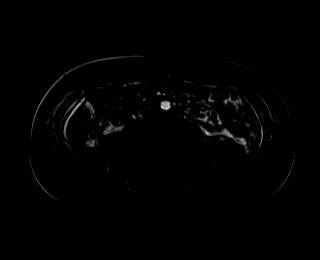
[im 40/80]
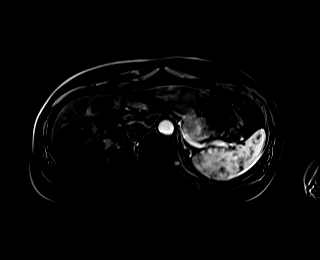
[im 80/80]
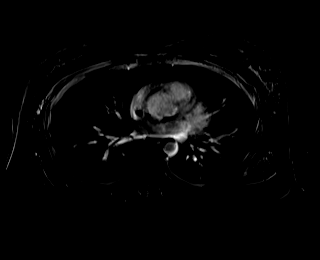

[Series 23: T1 dynamic · axial · 3.0mm · 1.25mm/px · z∈[+32,+269]mm · 3 of 80 slices shown (4 of 10)]
[im 1/80]
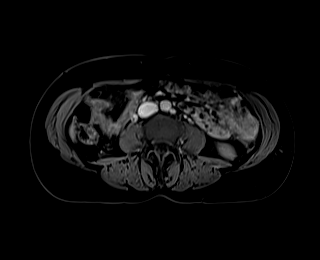
[im 40/80]
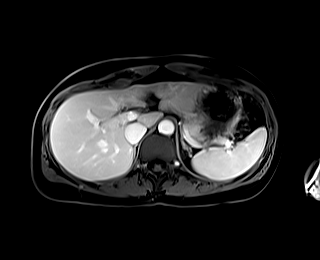
[im 80/80]
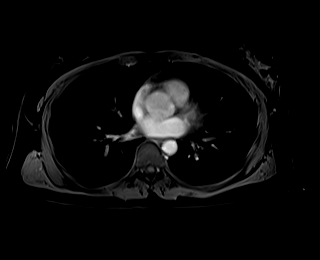

[Series 24: T1 dynamic · axial · 3.0mm · 1.25mm/px · z∈[+32,+269]mm · 3 of 80 slices shown (5 of 10)]
[im 1/80]
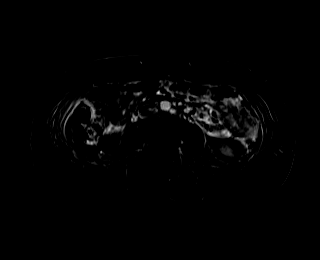
[im 40/80]
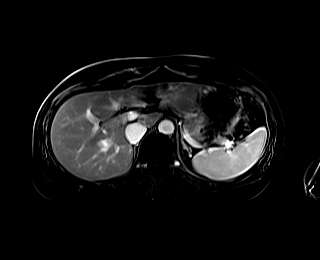
[im 80/80]
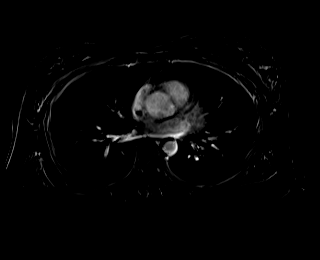

[Series 27: T1 dynamic · axial · 3.0mm · 1.25mm/px · z∈[+32,+269]mm · 3 of 80 slices shown (6 of 10)]
[im 1/80]
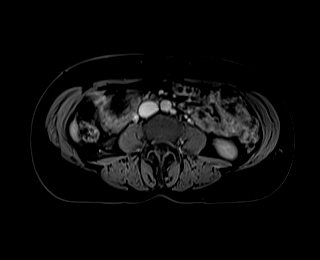
[im 40/80]
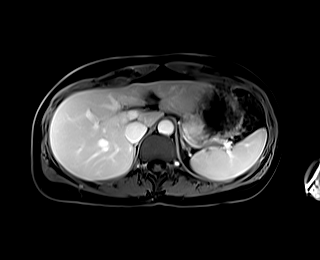
[im 80/80]
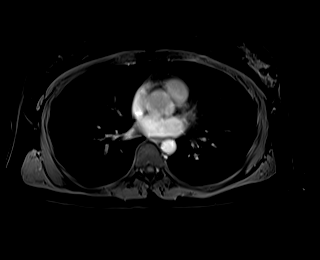

[Series 28: T1 dynamic · axial · 3.0mm · 1.25mm/px · z∈[+32,+269]mm · 3 of 80 slices shown (7 of 10)]
[im 1/80]
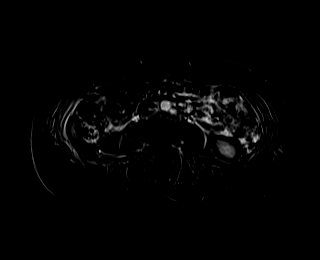
[im 40/80]
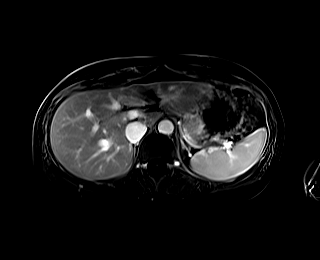
[im 80/80]
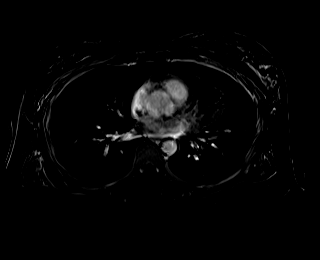

[Series 30: T1 dynamic · coronal · 3.0mm · 1.41mm/px · 3 of 72 slices shown (8 of 10)]
[im 1/72]
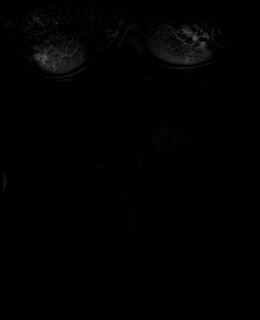
[im 36/72]
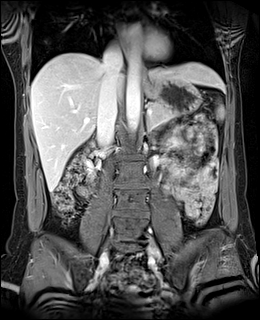
[im 72/72]
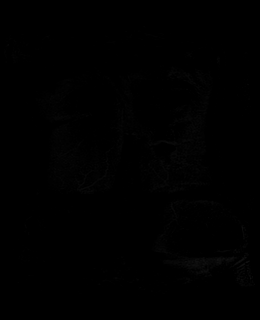

[Series 33: T1 dynamic · axial · 3.0mm · 1.25mm/px · z∈[+32,+269]mm · 3 of 80 slices shown (9 of 10)]
[im 1/80]
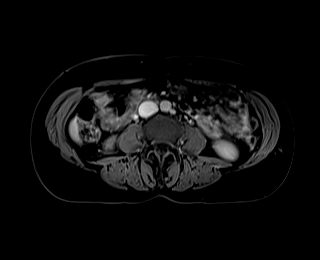
[im 40/80]
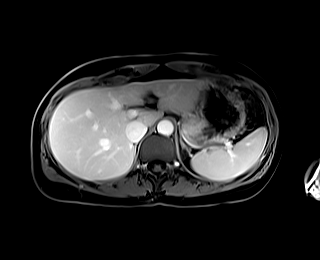
[im 80/80]
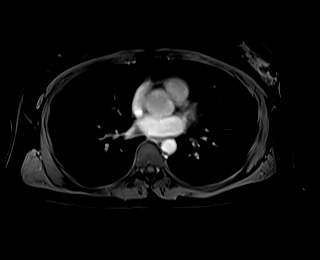

[Series 34: T1 dynamic · axial · 3.0mm · 1.25mm/px · z∈[+32,+269]mm · 3 of 80 slices shown (10 of 10)]
[im 1/80]
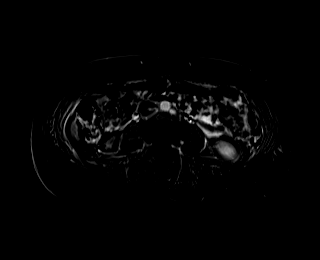
[im 40/80]
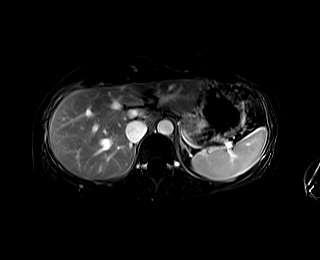
[im 80/80]
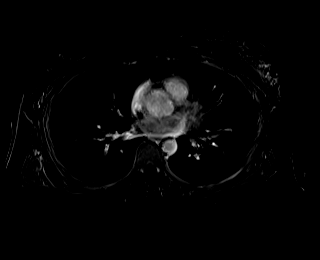

[45 of 48 positions shown; findings below may reference images not displayed]

FINDINGS: Lower chest:  Lung bases are clear.

Hepatobiliary: Postcontrast imaging demonstrates no focal hepatic
lesion.

No intrahepatic duct dilatation. The hepatic duct, common bile duct,
cystic duct and gallbladder normal.

superior to the gallbladder/cystic duct there is a small sac-like
cystic lesion measuring 5 mm (image [DATE]/MRCP sequence). This
appears to have a thin communicating neck/duct to the hepatic duct
above the cystic duct insertion or to the cystic duct self.

This lesion is also seen on coronal series 4, image 15 measuring 6
mm.

Pancreas: Normal pancreatic parenchyma. No pancreatic duct
dilatation. No enhancing pancreatic lesion. No variant pancreatic
ductal anatomy.

Spleen: Normal spleen.

Adrenals/urinary tract: Adrenal glands and kidneys are normal.

Stomach/Bowel: Stomach and limited of the small bowel is
unremarkable

Vascular/Lymphatic: Abdominal aortic normal caliber. No
retroperitoneal periportal lymphadenopathy.

Musculoskeletal: No aggressive osseous lesion
IMPRESSION: 1. Normal pancreas.
2. Normal liver.
3. Small sac-like cystic structure superior to the gallbladder with
apparent ductal insertion into the hepatic duct or cystic duct.
Findings suggest a rare very small duplicated gallbladder.

## 2019-10-08 MED ORDER — GADOBUTROL 1 MMOL/ML IV SOLN
7.0000 mL | Freq: Once | INTRAVENOUS | Status: AC | PRN
  Administered 2019-10-08: 20:00:00 7 mL via INTRAVENOUS

## 2019-10-10 ENCOUNTER — Ambulatory Visit
Admission: RE | Admit: 2019-10-10 | Discharge: 2019-10-10 | Disposition: A | Discharge status: Home / Self Care | Payer: BC Managed Care – PPO | Source: Ambulatory Visit | Attending: Oncology | Admitting: Oncology

## 2019-10-10 ENCOUNTER — Other Ambulatory Visit: Payer: Self-pay

## 2019-10-10 DIAGNOSIS — Z141 Cystic fibrosis carrier: Secondary | ICD-10-CM

## 2019-10-10 DIAGNOSIS — Z1509 Genetic susceptibility to other malignant neoplasm: Secondary | ICD-10-CM

## 2019-10-10 DIAGNOSIS — Z9189 Other specified personal risk factors, not elsewhere classified: Secondary | ICD-10-CM

## 2019-10-10 DIAGNOSIS — Z1239 Encounter for other screening for malignant neoplasm of breast: Secondary | ICD-10-CM

## 2019-10-10 IMAGING — MR MR BREAST BILAT WO/W CM
7 of 11 series · 32 of 48 positions shown · IV contrast (7ml gadavist)
Comparison: [DATE] screening mammogram

CLINICAL DATA: Breast cancer screening. Patient has greater than
20% lifetime breast. [FA] mutation carrier. History of breast
cancer diagnosed in her mother at age 49, sister at 47.

LABS:  Insert no bones
EXAM:
BILATERAL BREAST MRI WITH AND WITHOUT CONTRAST
TECHNIQUE: Multiplanar, multisequence MR images of both breasts were obtained
prior to and following the intravenous administration of 7 ml of
Gadavist

[Series 2: t2_tirm_tra ipat (a-p) · axial · 3.0mm · 0.70mm/px · z∈[-84,+78]mm · 3 of 55 slices shown]
[im 1/55]
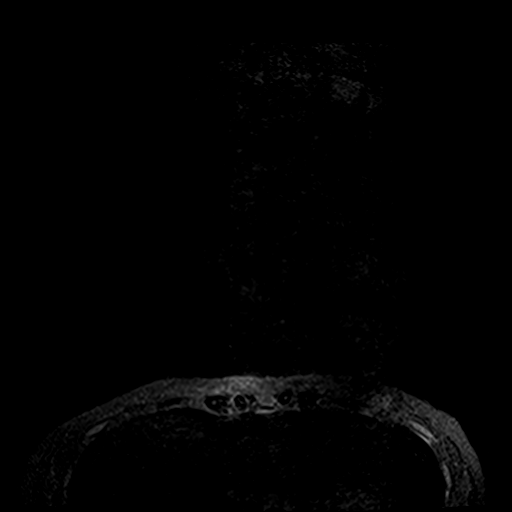
[im 28/55]
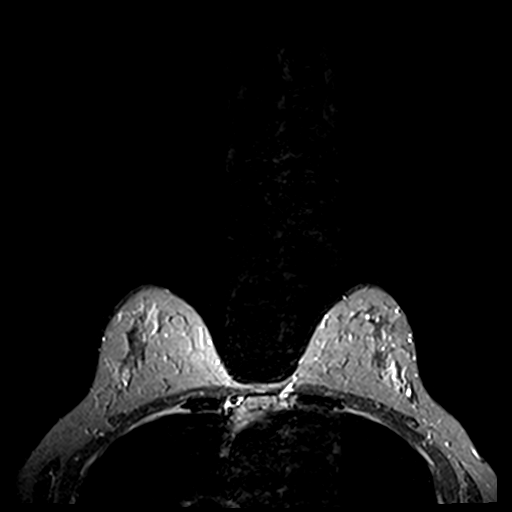
[im 55/55]
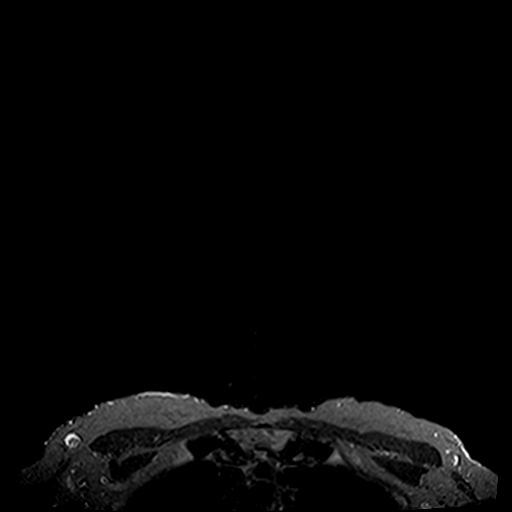

[Series 3: fl3d pre-cm no · axial · non-contrast · 1.2mm · 0.94mm/px · z∈[-89,+83]mm · 6 of 144 slices shown]
[im 1/144]
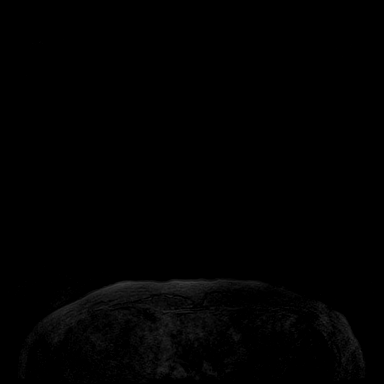
[im 29/144]
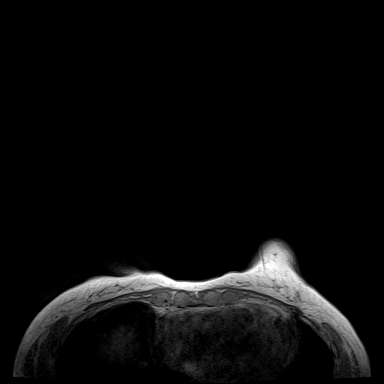
[im 58/144]
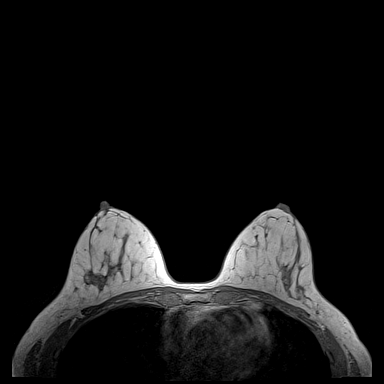
[im 86/144]
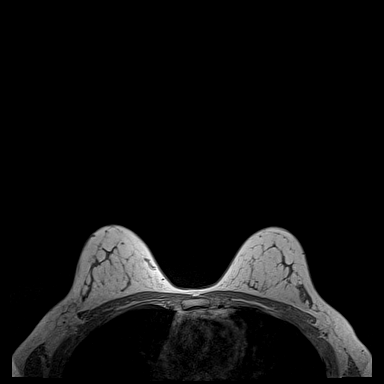
[im 115/144]
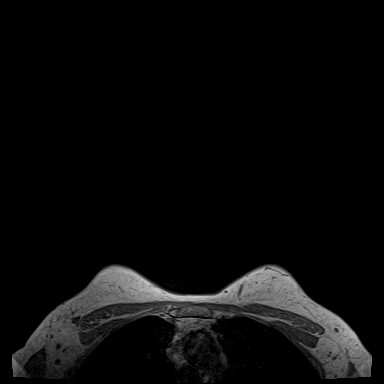
[im 144/144]
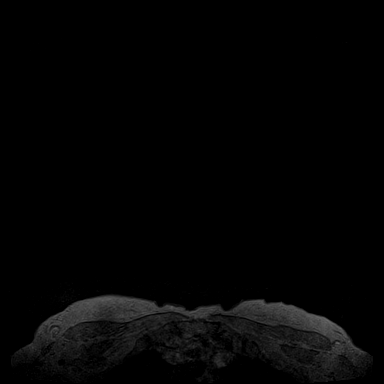

[Series 4: fl3d pre-cm · axial · non-contrast · 1.2mm · 0.94mm/px · z∈[-89,+83]mm · 6 of 144 slices shown]
[im 1/144]
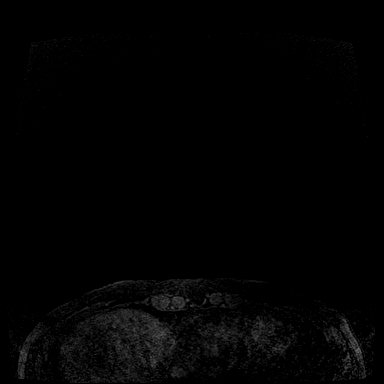
[im 29/144]
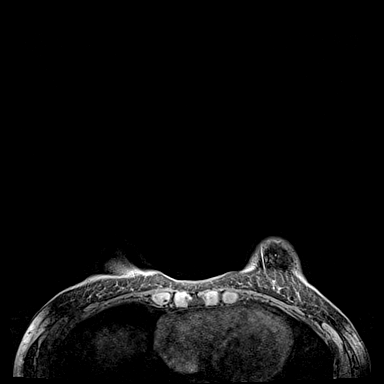
[im 58/144]
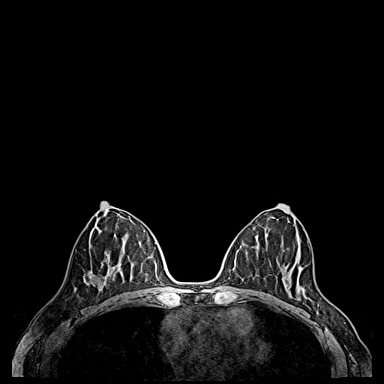
[im 86/144]
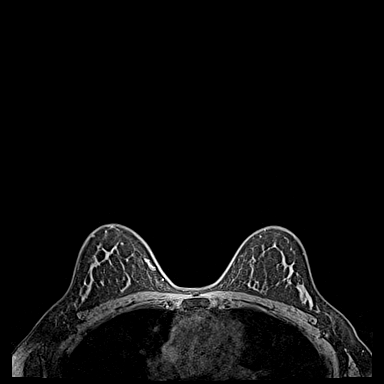
[im 115/144]
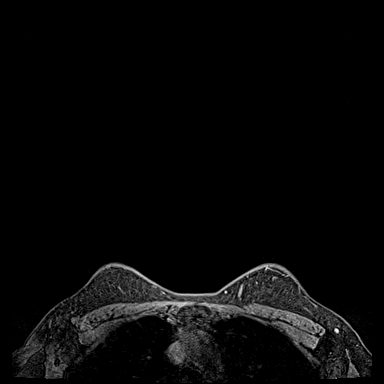
[im 144/144]
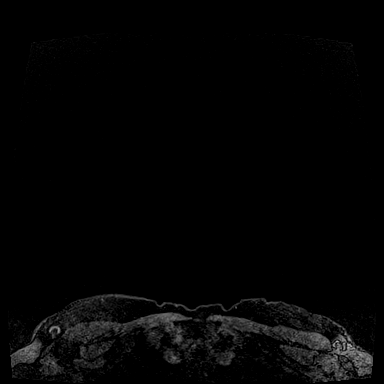

[Series 5: fl3d post-cm 20 · axial · 1.2mm · 0.94mm/px · z∈[-89,+83]mm · 6 of 144 slices shown (1 of 3)]
[im 1/144]
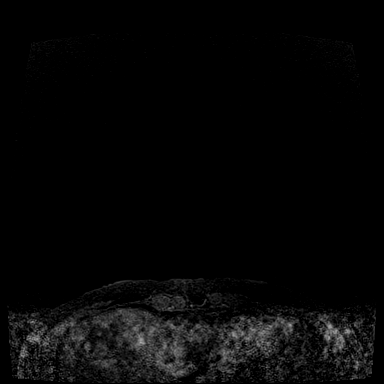
[im 29/144]
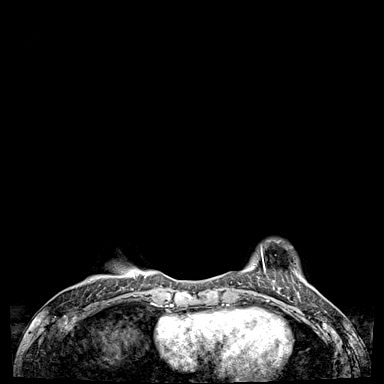
[im 58/144]
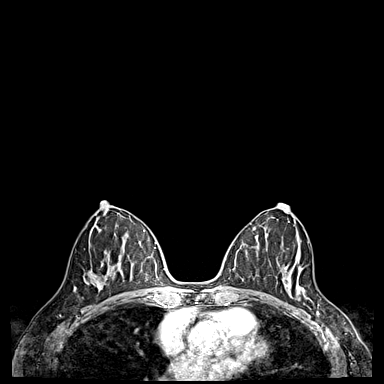
[im 86/144]
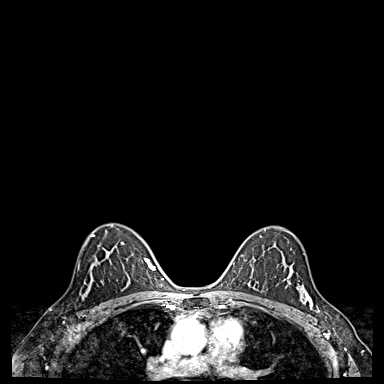
[im 115/144]
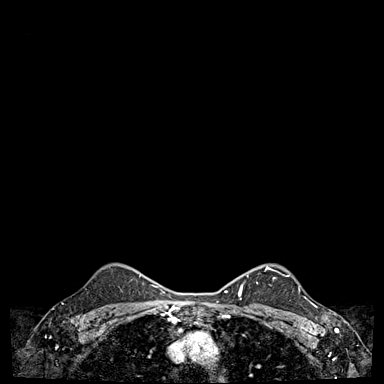
[im 144/144]
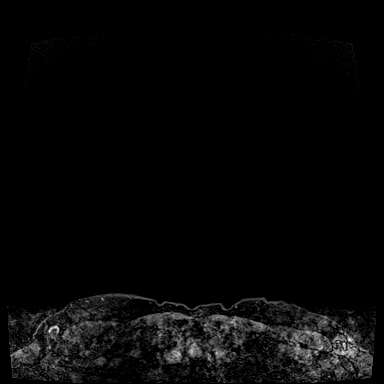

[Series 6: fl3d post-cm 20 · axial · 1.2mm · 0.94mm/px · z∈[-89,+83]mm · 6 of 144 slices shown (2 of 3)]
[im 1/144]
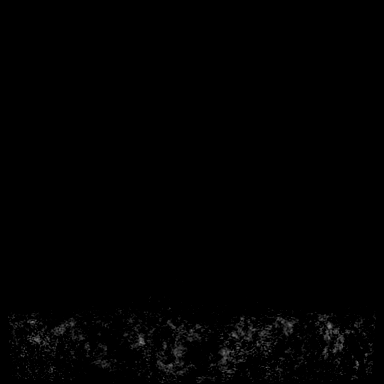
[im 29/144]
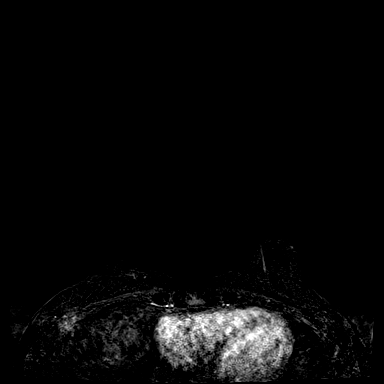
[im 58/144]
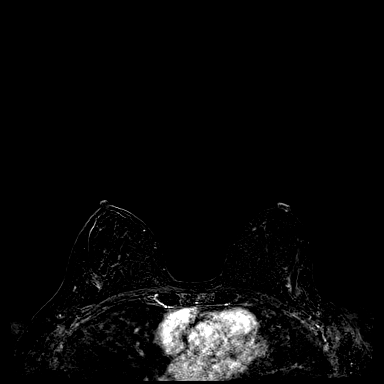
[im 86/144]
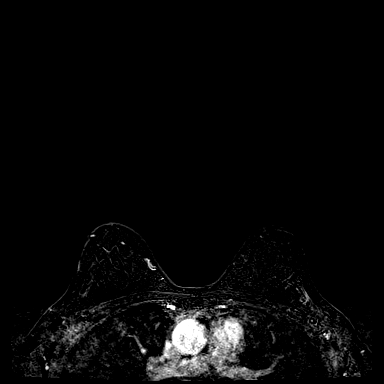
[im 115/144]
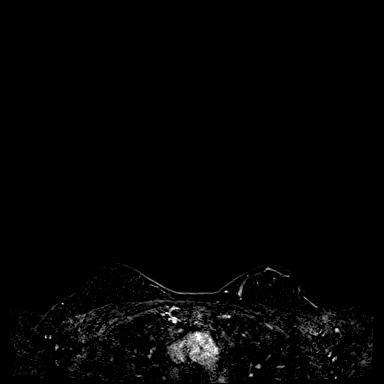
[im 144/144]
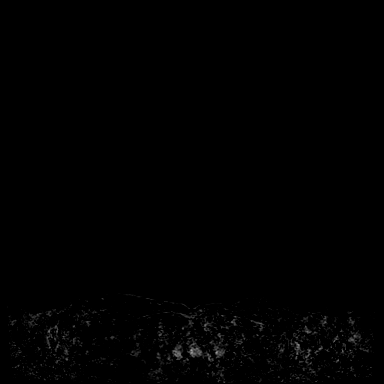

[Series 7: fl3d post-cm 20 · axial · 172.8mm · 0.94mm/px · 1 of 1 slices shown (3 of 3)]
[im 1/1]
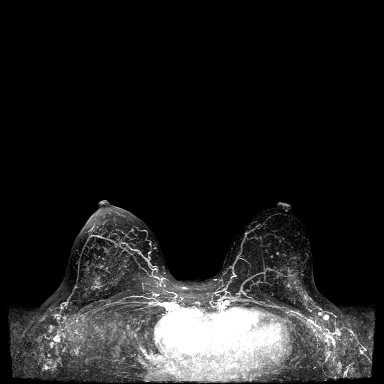

[Series 9: fl3d post-cm 3min_sub · axial · 1.2mm · 0.94mm/px · z∈[-89,+13]mm · 4 of 144 slices shown]
[im 1/144]
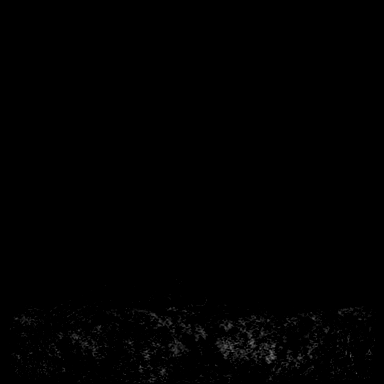
[im 29/144]
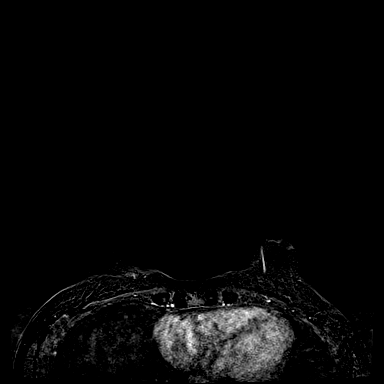
[im 58/144]
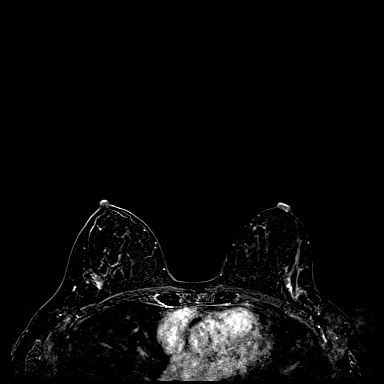
[im 86/144]
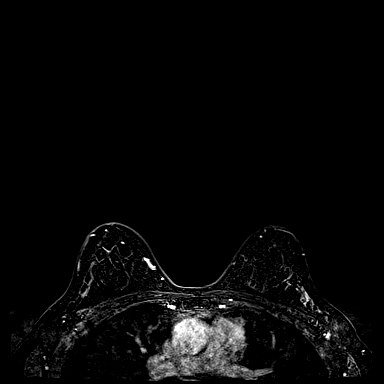

[32 of 48 positions shown; findings below may reference images not displayed]

Three-dimensional MR images were rendered by post-processing of the
original MR data on an independent workstation. The
three-dimensional MR images were interpreted, and findings are
reported in the following complete MRI report for this study. Three
dimensional images were evaluated at the independent [employer]
workstation
FINDINGS: Breast composition: c. Heterogeneous fibroglandular tissue.

Background parenchymal enhancement: Mild

Right breast: No mass or abnormal enhancement.

Left breast: No mass or abnormal enhancement.

Lymph nodes: No abnormal appearing lymph nodes.

Ancillary findings:  None.
IMPRESSION: No MRI evidence for malignancy in either breast.

RECOMMENDATION:
Screening mammogram in one year.(Code:[FA])

Based on the recommendations of the [employer], annual
screening MRI is suggested in addition to annual mammography if the
patient has an estimated lifetime risk of developing breast cancer
which is greater than 20%.

BI-RADS CATEGORY  1: Negative.

## 2019-10-10 MED ORDER — GADOBUTROL 1 MMOL/ML IV SOLN
7.0000 mL | Freq: Once | INTRAVENOUS | Status: AC | PRN
  Administered 2019-10-10: 11:00:00 7 mL via INTRAVENOUS

## 2019-10-11 ENCOUNTER — Encounter: Payer: Self-pay | Admitting: Oncology

## 2019-10-16 ENCOUNTER — Telehealth: Payer: Self-pay | Admitting: Oncology

## 2019-10-16 NOTE — Telephone Encounter (Signed)
Faxed demographics and insurance information to [facility] at [phone]. Information need to schedule patient for appt

## 2019-10-18 ENCOUNTER — Telehealth: Payer: Self-pay | Admitting: *Deleted

## 2019-10-18 NOTE — Telephone Encounter (Signed)
Call received from the patient who states she is wanting results for her MRI - she states she has called several times and has not heard from anyone.  Note pt states she is anxious- ( mother had breast cancer ) .  This RN informed her of normal reading of MRI of her breast performed 10-10-2019.  Suzanne Kerr stated appreciation as well as she will start the tamoxifen tomorrow " [birth year] my [birth year] ".  She also inquired about a follow up visit - informed of appointment on 12/23/2019 - she states she was unaware of appointment. Appointment noted as virtual visit with pt verifying stating " it is very hard for me to come in to the office- just brings up too many memories about my mom"  This RN apologized for calls and communication issues.  Suzanne Kerr stated appreciation and has no further needs at this time.

## 2019-10-29 ENCOUNTER — Encounter: Payer: Self-pay | Admitting: Gastroenterology

## 2019-11-29 ENCOUNTER — Encounter: Payer: Self-pay | Admitting: Gastroenterology

## 2019-11-29 ENCOUNTER — Ambulatory Visit (AMBULATORY_SURGERY_CENTER): Payer: BC Managed Care – PPO | Admitting: *Deleted

## 2019-11-29 ENCOUNTER — Other Ambulatory Visit: Payer: Self-pay

## 2019-11-29 VITALS — Ht 70.0 in | Wt 167.0 lb

## 2019-11-29 DIAGNOSIS — Z01818 Encounter for other preprocedural examination: Secondary | ICD-10-CM

## 2019-11-29 DIAGNOSIS — Z800 Family history of malignant neoplasm of digestive organs: Secondary | ICD-10-CM

## 2019-11-29 NOTE — Progress Notes (Signed)
Patient's pre-visit was done today over the phone with the patient due to COVID-19 pandemic. Name,DOB and address verified. Insurance verified. Packet of Prep instructions mailed to patient including copy of a consent -pt is aware. Patient understands to call us back with any questions or concerns. COVID-19 screening test is on 7/7 at 3 pm, the patient is aware. Pt is aware that care partner will wait in the car during procedure; if they feel like they will be too hot or cold to wait in the car; they may wait in the 4 th floor lobby. Patient is aware to bring only one care partner. We want them to wear a mask (we do not have any that we can provide them), practice social distancing, and we will check their temperatures when they get here.  I did remind the patient that their care partner needs to stay in the parking lot the entire time and have a cell phone available, we will call them when the pt is ready for discharge. Patient will wear mask into building. Patient denies any allergies to eggs or soy. Patient denies any problems with anesthesia/sedation. Patient denies any oxygen use at home. Patient denies taking any diet/weight loss medications or blood thinners. Patient is not being treated for MRSA or C-diff. Patient is aware of our care-partner policy and [id number]-[id number] safety protocol. EMMI education assisgned to the patient for the procedure, this was explained and instructions given to patient.   Pt has clenpiq at home. Pt denies any medical hx changes since GI OV.

## 2019-12-11 ENCOUNTER — Ambulatory Visit (INDEPENDENT_AMBULATORY_CARE_PROVIDER_SITE_OTHER): Payer: BC Managed Care – PPO

## 2019-12-11 ENCOUNTER — Other Ambulatory Visit: Payer: Self-pay | Admitting: Gastroenterology

## 2019-12-11 DIAGNOSIS — Z1159 Encounter for screening for other viral diseases: Secondary | ICD-10-CM

## 2019-12-12 LAB — SARS CORONAVIRUS 2 (TAT 6-24 HRS): SARS Coronavirus 2: NEGATIVE

## 2019-12-13 ENCOUNTER — Ambulatory Visit (AMBULATORY_SURGERY_CENTER): Payer: BC Managed Care – PPO | Admitting: Gastroenterology

## 2019-12-13 ENCOUNTER — Other Ambulatory Visit: Payer: Self-pay

## 2019-12-13 ENCOUNTER — Encounter: Payer: Self-pay | Admitting: Gastroenterology

## 2019-12-13 VITALS — BP 97/39 | HR 51 | Temp 97.3°F | Resp 12 | Ht 70.0 in | Wt 167.0 lb

## 2019-12-13 DIAGNOSIS — Z800 Family history of malignant neoplasm of digestive organs: Secondary | ICD-10-CM

## 2019-12-13 DIAGNOSIS — K644 Residual hemorrhoidal skin tags: Secondary | ICD-10-CM

## 2019-12-13 DIAGNOSIS — Z1211 Encounter for screening for malignant neoplasm of colon: Secondary | ICD-10-CM | POA: Diagnosis not present

## 2019-12-13 DIAGNOSIS — D125 Benign neoplasm of sigmoid colon: Secondary | ICD-10-CM

## 2019-12-13 DIAGNOSIS — K640 First degree hemorrhoids: Secondary | ICD-10-CM

## 2019-12-13 MED ORDER — SODIUM CHLORIDE 0.9 % IV SOLN: 500.0000 mL | Freq: Once | INTRAVENOUS | Status: DC

## 2019-12-13 NOTE — Progress Notes (Signed)
Pt's states no medical or surgical changes since previsit or office visit. 

## 2019-12-13 NOTE — Progress Notes (Signed)
zofran ok'd by Dr Loletha Grayer

## 2019-12-13 NOTE — Progress Notes (Signed)
Report to PACU, RN, vss, BBS= Clear.  

## 2019-12-13 NOTE — Patient Instructions (Signed)
Thank you for allowing [place] to care for you today!  Await pathology results of polyps removed, approximately 7-10 days by mail.  Recommend next colonoscopy in 5 years for surveillance due to family history.  Recommend daily fiber for example Citrucel, Fibercon, Konsyl, or Metamucil.  Resume previous diet and medications today.  Return to your normal activities tomorrow.     YOU HAD AN ENDOSCOPIC PROCEDURE TODAY AT [facility]:   Refer to the procedure report that was given to you for any specific questions about what was found during the examination.  If the procedure report does not answer your questions, please call your gastroenterologist to clarify.  If you requested that your care partner not be given the details of your procedure findings, then the procedure report has been included in a sealed envelope for you to review at your convenience later.  YOU SHOULD EXPECT: Some feelings of bloating in the abdomen. Passage of more gas than usual.  Walking can help get rid of the air that was put into your GI tract during the procedure and reduce the bloating. If you had a lower endoscopy (such as a colonoscopy or flexible sigmoidoscopy) you may notice spotting of blood in your stool or on the toilet paper. If you underwent a bowel prep for your procedure, you may not have a normal bowel movement for a few days.  Please Note:  You might notice some irritation and congestion in your nose or some drainage.  This is from the oxygen used during your procedure.  There is no need for concern and it should clear up in a day or so.  SYMPTOMS TO REPORT IMMEDIATELY:   Following lower endoscopy (colonoscopy or flexible sigmoidoscopy):  Excessive amounts of blood in the stool  Significant tenderness or worsening of abdominal pains  Swelling of the abdomen that is new, acute  Fever of 100F or higher   For urgent or emergent issues, a gastroenterologist can be reached at any hour by  calling [phone]. Do not use MyChart messaging for urgent concerns.    DIET:  We do recommend a small meal at first, but then you may proceed to your regular diet.  Drink plenty of fluids but you should avoid alcoholic beverages for 24 hours.  ACTIVITY:  You should plan to take it easy for the rest of today and you should NOT DRIVE or use heavy machinery until tomorrow (because of the sedation medicines used during the test).    FOLLOW UP: Our staff will call the number listed on your records 48-72 hours following your procedure to check on you and address any questions or concerns that you may have regarding the information given to you following your procedure. If we do not reach you, we will leave a message.  We will attempt to reach you two times.  During this call, we will ask if you have developed any symptoms of COVID 19. If you develop any symptoms (ie: fever, flu-like symptoms, shortness of breath, cough etc.) before then, please call [phone].  If you test positive for Covid 19 in the 2 weeks post procedure, please call and report this information to [place].    If any biopsies were taken you will be contacted by phone or by letter within the next 1-3 weeks.  Please call us at [phone] if you have not heard about the biopsies in 3 weeks.    SIGNATURES/CONFIDENTIALITY: You and/or your care partner have signed paperwork which will  be entered into your electronic medical record.  These signatures attest to the fact that that the information above on your After Visit Summary has been reviewed and is understood.  Full responsibility of the confidentiality of this discharge information lies with you and/or your care-partner. 

## 2019-12-13 NOTE — Op Note (Signed)
[facility] Patient Name: Suzanne Kerr Procedure Date: 12/13/2019 8:23 AM MRN: [id number] Endoscopist: Gerrit Heck , MD Age: 44 Referring MD:  Date of Birth: 1976/02/11 Gender: Female Account #: [id number] Procedure:                Colonoscopy Indications:              Colon cancer screening in patient at increased                            risk: Family history of colon polyps in multiple                            1st-degree relatives, Screening in patient at                            increased risk: Colorectal cancer in sister before                            age 45, This is the patient's first colonoscopy                           Also with a known PALB2 mutation. Medicines:                Monitored Anesthesia Care Procedure:                Pre-Anesthesia Assessment:                           - Prior to the procedure, a History and Physical                            was performed, and patient medications and                            allergies were reviewed. The patient's tolerance of                            previous anesthesia was also reviewed. The risks                            and benefits of the procedure and the sedation                            options and risks were discussed with the patient.                            All questions were answered, and informed consent                            was obtained. Prior Anticoagulants: The patient has                            taken no previous anticoagulant or antiplatelet  agents. ASA Grade Assessment: II - A patient with                            mild systemic disease. After reviewing the risks                            and benefits, the patient was deemed in                            satisfactory condition to undergo the procedure.                           After obtaining informed consent, the colonoscope                            was passed under direct vision.  Throughout the                            procedure, the patient's blood pressure, pulse, and                            oxygen saturations were monitored continuously. The                            Colonoscope was introduced through the anus and                            advanced to the the terminal ileum. The colonoscopy                            was performed without difficulty. The patient                            tolerated the procedure well. The quality of the                            bowel preparation was excellent. The terminal                            ileum, ileocecal valve, appendiceal orifice, and                            rectum were photographed. Scope In: 8:32:20 AM Scope Out: 8:47:52 AM Scope Withdrawal Time: 0 hours 11 minutes 48 seconds  Total Procedure Duration: 0 hours 15 minutes 32 seconds  Findings:                 Skin tags were found on perianal exam.                           Two sessile polyps were found in the sigmoid colon.                            The polyps were 2 mm in size. These polyps  were                            removed with a cold biopsy forceps. Resection and                            retrieval were complete. Estimated blood loss was                            minimal.                           Non-bleeding internal hemorrhoids were found during                            retroflexion. The hemorrhoids were small.                           The exam was otherwise normal throughout the                            remainder of the colon.                           The terminal ileum appeared normal. Complications:            No immediate complications. Estimated Blood Loss:     Estimated blood loss was minimal. Impression:               - Perianal skin tags found on perianal exam.                           - Two 2 mm polyps in the sigmoid colon, removed                            with a cold biopsy forceps. Resected and retrieved.                            - Non-bleeding internal hemorrhoids.                           - The examined portion of the ileum was normal. Recommendation:           - Patient has a contact number available for                            emergencies. The signs and symptoms of potential                            delayed complications were discussed with the                            patient. Return to normal activities tomorrow.                            Written discharge instructions were provided to the  patient.                           - Resume previous diet.                           - Continue present medications.                           - Await pathology results.                           - Repeat colonoscopy in 5 years for surveillance                            due to family history.                           - Return to GI office in 4 months.                           - Use fiber, for example Citrucel, Fibercon, Konsyl                            or Metamucil.                           - Internal hemorrhoids were noted on this study and                            may be amenable to hemorrhoid band ligation. If you                            are interested in further treatment of these                            hemorrhoids with band ligation, please contact my                            clinic to set up an appointment for evaluation and                            treatment. Gerrit Heck, MD 12/13/2019 8:54:34 AM

## 2019-12-17 ENCOUNTER — Telehealth: Payer: Self-pay

## 2019-12-17 NOTE — Telephone Encounter (Signed)
  Follow up Call-  Call back number 12/13/2019  Post procedure Call Back phone  # [phone]  Permission to leave phone message Yes  Some recent data might be hidden     Patient questions:  Do you have a fever, pain , or abdominal swelling? No. Pain Score  0 *  Have you tolerated food without any problems? Yes.    Have you been able to return to your normal activities? Yes.    Do you have any questions about your discharge instructions: Diet   No. Medications  No. Follow up visit  No.  Do you have questions or concerns about your Care? No.  Actions: * If pain score is 4 or above: No action needed, pain <4.   1. Have you developed a fever since your procedure? no  2.   Have you had an respiratory symptoms (SOB or cough) since your procedure? no  3.   Have you tested positive for COVID 19 since your procedure no  4.   Have you had any family members/close contacts diagnosed with the COVID 19 since your procedure?  no   If yes to any of these questions please route to Joylene John, RN and Erenest Rasher, RN

## 2019-12-18 ENCOUNTER — Ambulatory Visit: Payer: BC Managed Care – PPO | Admitting: Dermatology

## 2019-12-19 NOTE — Progress Notes (Signed)
a 

## 2019-12-20 ENCOUNTER — Encounter: Payer: Self-pay | Admitting: Gastroenterology

## 2019-12-22 NOTE — Progress Notes (Signed)
[place]  [phone] [phone]     ID: Suzanne Kerr DOB: 1976-05-01  MR#: [id number]  DJM#:[id number]  Patient Care Team: Jonathon Jordan, MD as PCP - General (Family Medicine) Donivan Thammavong, Virgie Dad, MD as Consulting Physician (Oncology) Lavena Bullion, DO as Consulting Physician (Gastroenterology) Delrae Rend, MD as Consulting Physician (Endocrinology) Orbie Hurst, MD as Referring Physician (Dermatology) Chauncey Cruel, MD OTHER MD:  I connected with Exie Parody on 12/23/19 at  3:30 PM EDT by telephone visit and verified that I am speaking with the correct person using two identifiers.   I discussed the limitations, risks, security and privacy concerns of performing an evaluation and management service by telemedicine and the availability of in-person appointments. I also discussed with the patient that there may be a patient responsible charge related to this service. The patient expressed understanding and agreed to proceed.   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 She was evaluated in the high risk breast cancer clinic on 09/18/2019.   She started tamoxifen on 10/19/2021 (her birthday).  She is taking it at 20 mg daily.  She had some leg cramps which are improving.  She had minimal hot flashes.  She still having regular periods although instead of lasting a week they last about 5 days.  She has had no other side effects that she is aware of  Since her last visit here she met with Dr. Bryan Lemma and initiated pancreatic cancer screening protocol May 2021.  She also underwent her initial colonoscopy 12/13/2019.  This found only  colonic mucosa with an underlying lymphoid aggregate, no evidence of dysplasia ([place]).  She also underwent breast MRI 10/11/2019.  This showed no evidence of malignancy in either breast.  She has scheduled an appointment with Dr. Tamala Julian in [place] office to initiate melanoma screening.  REVIEW OF SYSTEMS: Amara feels a little bit more tired.  She did lose some sleep because of leg cramps.  That is improving.  She has not yet had her COVID-19 vaccine.  She tells me her thyroid was "out of balance" and she is working with Dr. Buddy Duty to take care of that problem.  She did well with her colonoscopy and MRCP and had no problems with the technical aspects of the MRI.  She is biking swimming walking and otherwise being very active.  Aside from these issues a detailed review of systems today was stable   HISTORY OF CURRENT ILLNESS: Suzanne Kerr has a known family mutation in [place].  Her family history includes breast cancer, colon cancer, and melanoma.  There is no family history of ovarian or pancreatic cancers.  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  She does not have a history of pancreatitis.  Mozel underwent genetic counseling on 11/13/2018. Her results showed a positive PALB2 mutation but not the WT1 variant of uncertain significance noted in her sister.  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 Breast cancer Mother 79  . Heart disease Father   . Colon polyps Father   . Breast cancer Sister 27       PALB2+  . Colon cancer Sister 64  . Thyroid cancer Sister        early 73s  . Colon polyps Sister   . Colon polyps Brother   . Stroke Maternal  Grandmother   . Melanoma Maternal Grandfather   . Heart disease Paternal Grandfather   . Breast cancer Other        MGMs sister; post-menopausal cancer  . Esophageal cancer Neg Hx   . Rectal cancer Neg Hx   . Stomach cancer Neg Hx   Thepatient'sfather died of heart disease. He was an only child. His parents are deceased. His mother died at 34 from old age, and his father died of heart disease. The patient's mother had breast cancer in her late40'sand died at 29 from a recurrence. She has two brothers who are cancer free. Her father had melanoma and her mother died of a stroke. The patient's maternal grandmother had 100 sisters, one had breast cancer post-menopause.  The patient has a sister with breast cancer at 80, colon cancer at 42 and probable thyroid cancer in her early 54's.  The patient has 1 brother who is cancer free.   GYNECOLOGIC HISTORY:  No LMP recorded. Menarche: 64-13 years old Age at first live birth: 44 years old [place] P1 LMP regular, ongoing Contraceptive: never used HRT n/a  Hysterectomy? no BSO? 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  Her husband Shanon Brow is a Barista.  Their daughter, Colletta [place], is 48 as of April 2021.    ADVANCED DIRECTIVES: In the absence of any documents to the contrary the patient's husband is her healthcare power of attorney   HEALTH MAINTENANCE: Social History   Tobacco Use  . Smoking status: Never Smoker  . Smokeless tobacco: Never Used  Vaping Use  . Vaping Use: Never used  Substance Use Topics  . Alcohol use: Not Currently  . Drug use: Never     Colonoscopy:   PAP:   Bone density:   Allergies  Allergen Reactions  . Tranexamic Acid Rash    Stomach upset    Current Outpatient Medications  Medication Sig Dispense Refill  . levothyroxine (SYNTHROID) 150 MCG tablet Take 150 mcg by mouth daily before breakfast.    . tamoxifen (NOLVADEX) 10 MG tablet Take 10 mg by  mouth daily.     No current facility-administered medications for this visit.    OBJECTIVE: White woman contacted by phone  There were no vitals filed for this visit.   There is no height or weight on file to calculate BMI.   Wt Readings from Last 3 Encounters:  12/13/19 167 lb (75.8 kg)  11/29/19 167 lb (75.8 kg)  09/24/19 167 lb 2 oz (75.8 kg)      ECOG FS:0 - Asymptomatic  Telemedicine visit 12/23/2019   LAB RESULTS:  CMP  No results found for: NA, K, CL, CO2, GLUCOSE, BUN, CREATININE, CALCIUM, PROT, ALBUMIN, AST, ALT, ALKPHOS, BILITOT, GFRNONAA, GFRAA  No results found for: TOTALPROTELP, ALBUMINELP, A1GS, A2GS, BETS, BETA2SER, GAMS, MSPIKE, SPEI  Lab Results  Component Value Date   WBC 4.8 05/05/2008   HGB 13.9 05/05/2008   HCT 41.6 05/05/2008   MCV 86.1 05/05/2008  PLT 223 05/05/2008    No results found for: LABCA2  No components found for: ERDEYC144  No results for input(s): INR in the last 168 hours.  No results found for: LABCA2  No results found for: YJE563  No results found for: JSH702  No results found for: OVZ858  No results found for: CA2729  No components found for: HGQUANT  No results found for: CEA1 / No results found for: CEA1   No results found for: AFPTUMOR  No results found for: CHROMOGRNA  No results found for: KPAFRELGTCHN, LAMBDASER, KAPLAMBRATIO (kappa/lambda light chains)  No results found for: HGBA, HGBA2QUANT, HGBFQUANT, HGBSQUAN (Hemoglobinopathy evaluation)   No results found for: LDH  No results found for: IRON, TIBC, IRONPCTSAT (Iron and TIBC)  No results found for: FERRITIN  Urinalysis No results found for: COLORURINE, APPEARANCEUR, LABSPEC, PHURINE, GLUCOSEU, HGBUR, BILIRUBINUR, KETONESUR, PROTEINUR, UROBILINOGEN, NITRITE, LEUKOCYTESUR   STUDIES: No results found.   ELIGIBLE FOR AVAILABLE RESEARCH PROTOCOL:AET  ASSESSMENT: 44 y.o. 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 and a family history of breast, and colon cancers and melanoma, no family history of ovarian or pancreatic cancers  (a) patient is also a cystic fibrosis carrier  (1) intensified screening for breast cancer:  (a) breast MRI yearly, initiated 10/10/2019  (b) mammography yearly, next due November 2021  (2) breast cancer risk reduction:  (a) started tamoxifen 10/19/2019  (b) considering bilateral mastectomies with reconstruction  (3) pancreatic cancer screening (per Dr Bryan Lemma)  (a) implemented May 2021 and ff  (3) other cancer screening based on family history:  (a) yearly dermatology screen for melanoma  (b) colonoscopy 12/13/2019 and every 5 years   PLAN: Natika has jumped into cancer screening with both feet and is doing a Fish farm manager job of it.  She is also tolerating it well.  I am delighted that she can take the tamoxifen which will cut her risk of breast cancer in half.  I thought I had entered a 10 mg dose but she tells me she has been taking 20 mg.  In any case the refill will be 20 mg and she will let me know if that is an issue for her  For intensified screening she had her MRI in early May so she will have her mammography in early November.  She will then repeat the MRI next May and I will see her after that  She knows to call for any other issue that may develop before the next visit.   Virgie Dad. Jillien Yakel, MD 12/23/2019 3:43 PM Medical Oncology and Hematology [facility] [place], [place] [address] Tel. [phone]    Fax. [phone]   This document serves as a record of services personally performed by Lurline Del, MD. It was created on his behalf by Wilburn Mylar, a trained medical scribe. The creation of this record is based on the scribe's personal observations and the provider's statements to them.   I, Lurline Del MD, have reviewed the above documentation for accuracy and completeness, and I agree with the  above.   *Total Encounter Time as defined by the Centers for Medicare and Medicaid Services includes, in addition to the face-to-face time of a patient visit (documented in the note above) non-face-to-face time: obtaining and reviewing outside history, ordering and reviewing medications, tests or procedures, care coordination (communications with other health care professionals or caregivers) and documentation in the medical record.

## 2019-12-23 ENCOUNTER — Inpatient Hospital Stay: Payer: BC Managed Care – PPO | Attending: Genetic Counselor | Admitting: Oncology

## 2019-12-23 DIAGNOSIS — Z1501 Genetic susceptibility to malignant neoplasm of breast: Secondary | ICD-10-CM

## 2019-12-23 DIAGNOSIS — Z1589 Genetic susceptibility to other disease: Secondary | ICD-10-CM | POA: Diagnosis not present

## 2019-12-23 DIAGNOSIS — Z1509 Genetic susceptibility to other malignant neoplasm: Secondary | ICD-10-CM

## 2019-12-23 MED ORDER — TAMOXIFEN CITRATE 20 MG PO TABS: 20.0000 mg | ORAL_TABLET | Freq: Every day | ORAL | 4 refills | Status: DC

## 2019-12-24 ENCOUNTER — Other Ambulatory Visit: Payer: Self-pay

## 2019-12-24 ENCOUNTER — Telehealth: Payer: Self-pay | Admitting: Oncology

## 2019-12-24 NOTE — Telephone Encounter (Signed)
Scheduled appts per 7/19 los. Pt confirmed appt date and time.

## 2020-02-05 ENCOUNTER — Encounter: Payer: Self-pay | Admitting: Gastroenterology

## 2020-02-05 ENCOUNTER — Other Ambulatory Visit: Payer: Self-pay

## 2020-02-05 ENCOUNTER — Ambulatory Visit: Payer: BC Managed Care – PPO | Admitting: Gastroenterology

## 2020-02-05 VITALS — BP 100/60 | HR 79 | Ht 70.0 in | Wt 163.4 lb

## 2020-02-05 DIAGNOSIS — Z808 Family history of malignant neoplasm of other organs or systems: Secondary | ICD-10-CM

## 2020-02-05 DIAGNOSIS — Z1589 Genetic susceptibility to other disease: Secondary | ICD-10-CM

## 2020-02-05 DIAGNOSIS — Z800 Family history of malignant neoplasm of digestive organs: Secondary | ICD-10-CM

## 2020-02-05 DIAGNOSIS — K641 Second degree hemorrhoids: Secondary | ICD-10-CM | POA: Diagnosis not present

## 2020-02-05 DIAGNOSIS — K649 Unspecified hemorrhoids: Secondary | ICD-10-CM

## 2020-02-05 DIAGNOSIS — Z1509 Genetic susceptibility to other malignant neoplasm: Secondary | ICD-10-CM

## 2020-02-05 DIAGNOSIS — K644 Residual hemorrhoidal skin tags: Secondary | ICD-10-CM

## 2020-02-05 NOTE — Progress Notes (Signed)
P  Chief Complaint:    Symptomatic Internal Hemorrhoids; Hemorrhoid Band Ligation  GI History:  44 y.o. female  follows in the Gastroenterology Clinic for elevated risk Pancreatic Cancer screening and Colon cancer screening. She has a known familial mutation in [place], with family history as outlined below.  She was recently seen by Dr. Jana Hakim Oncology Clinic/high risk breast cancer clinic on 09/18/2019 and by Roma Kayser in the [facility] on 08/22/2019.  Family malignancy history: -Mother: Breast cancer (73) - Father: Colon polyps -Maternal grandfather: Melanoma -Sister: Breast cancer, PALB2+ (74), then colon cancer (49), and probably thyroid cancer (early 62s) - Brother: Colon polyps (age 54) -No known family history of pancreatic cancer or ovarian cancer  Genetic testing (11/19/2018): identified a single, heterozygous pathogenic gene mutation calledPALB2 c.3362del.The VUS in WT1 was not identified (this is the genetic abnormality detected in her sister).  She additionally reports being a carrier for cystic fibrosis (as is her sister) diagnosed during pregnancy.  Pancreatic Cancer screening initiated in 10/2019: -Hemoglobin A1c (10/2019): Normal.  Repeat fasting BG every 6 months (04/2020) -MRI/MRCP (10/2019): Normal.  Repeat 10/2018. -EUS: To be scheduled 04/2020  Colon cancer screening: -Colonoscopy (12/2019): 2 benign polyps, internal hemorrhoids, otherwise normal.  Repeat 5 years  Given elevated risk for Breast Cancer, started on tamoxifen in 10/2019 with plan for intensified screening with yearly breast MRI and yearly mammography.  Follows with Dermatology given family history of Melanoma.  History of internal hemorrhoids and skin tags. -02/05/20: Hemorrhoid banding #1  HPI:     Patient is a 44 y.o. femalewith a history of symptomatic internal hemorrhoids presenting to the Gastroenterology Clinic for follow-up and ongoing treatment. The patient presents with  symptomatic grade 2 hemorrhoids, unresponsive to maximal medical therapy, requesting rubber band ligation of symptomatic hemorrhoidal disease.  She additionally follows in the GI clinic for increased risk screening for Pancreatic Cancer and Colon Cancer.  Up-to-date on all screening as outlined above.    No change in medical or surgical history, medications, allergies, social history since last appointment with me.   Review of systems:     No chest pain, no SOB, no fevers, no urinary sx   Past Medical History:  Diagnosis Date  . Anemia   . Family history of breast cancer   . Family history of colon cancer   . Family history of melanoma   . Hypothyroidism     Patient's surgical history, family medical history, social history, medications and allergies were all reviewed in Epic    Current Outpatient Medications  Medication Sig Dispense Refill  . levothyroxine (SYNTHROID) 137 MCG tablet Take 137 mcg by mouth daily.    . tamoxifen (NOLVADEX) 20 MG tablet Take 1 tablet (20 mg total) by mouth daily. 90 tablet 4   No current facility-administered medications for this visit.    Physical Exam:     BP 100/60   Pulse 79   Ht 5' 10"  (1.778 m)   Wt 163 lb 6 oz (74.1 kg)   BMI 23.44 kg/m   GENERAL:  Pleasant female in NAD PSYCH: : Cooperative, normal affect NEURO: Alert and oriented x 3, no focal neurologic deficits Rectal exam: Sensation intact and preserved anal wink.  Grade 2 hemorrhoids noted in all positions on anoscopy.  Small external skin tags noted.  No external anal fissures noted. Normal sphincter tone. No palpable mass. No blood on the exam glove. (Chaperone: Curlene Labrum, CMA).   IMPRESSION and PLAN:    #  1.  Symptomatic internal hemorrhoids: PROCEDURE NOTE: The patient presents with symptomatic grade 2 hemorrhoids, unresponsive to maximal medical therapy, requesting rubber band ligation of symptomatic hemorrhoidal disease.  All risks, benefits and alternative forms of  therapy were described and informed consent was obtained.  In the Left Lateral Decubitus position, anoscopic examination revealed grade 2 hemorrhoids in the all position(s).  The anorectum was pre-medicated with RectiCare. The decision was made to band the LL internal hemorrhoid, and the [place] was used to perform band ligation without complication.  Digital anorectal examination was then performed to assure proper positioning of the band, and to adjust the banded tissue as required.  The patient was discharged home without pain or other issues.  Dietary and behavioral recommendations were given and along with follow-up instructions.     The following adjunctive treatments were recommended:  -Resume high-fiber diet with fiber supplement (i.e. Citrucel or Benefiber) with goal for soft stools without straining to have a BM. -Resume adequate fluid intake.  The patient will return in 2-4 for  follow-up and possible additional banding as required. No complications were encountered and the patient tolerated the procedure well.      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  Additionally, she is a cystic fibrosis gene carrier.  Otherwise, no known family history of pancreatic CA, and no personal history of pancreatitis.  Based on having two PC predisposing genetic mutations, initiated Pancreatic Cancer screening in 10/2019.  -UTD on screening -Schedule EUS for 04/2020 -Repeat MRI/MRCP 10/2020 -Check fasting blood glucose and/or hemoglobin A1c every 6 months (04/2020) -Screening interval every 6 months as above in patients with no abnormalities/nonconcerning abnormalities.  Modification to screening intervals/modalities if abnormal findings (see my note from 09/2019) -New-onset diabetes in a high-risk individual should lead to additional  diagnostic studies or change in surveillance interval -Following in the Oncology clinic  4) Family history of colon cancer -Sister diagnosed with colon cancer around age 84 -Colonoscopy unremarkable in 2021.  Repeat in 2026 (5 years)  5) Family history of Melanoma: -Following in the Dermatology Clinic  6) Increased risk for breast cancer -Follows with Dr. Jana Hakim with intensified screening and started on tamoxifen       Lavena Bullion ,DO, FACG 02/05/2020, 3:31 PM

## 2020-02-05 NOTE — Patient Instructions (Addendum)
If you are age 46 or older, your body mass index should be between 23-30. Your Body mass index is 23.44 kg/m. If this is out of the aforementioned range listed, please consider follow up with your Primary Care Provider.  If you are age 58 or younger, your body mass index should be between 19-25. Your Body mass index is 23.44 kg/m. If this is out of the aformentioned range listed, please consider follow up with your Primary Care Provider.   HEMORRHOID BANDING PROCEDURE    FOLLOW-UP CARE   1. The procedure you have had should have been relatively painless since the banding of the area involved does not have nerve endings and there is no pain sensation.  The rubber band cuts off the blood supply to the hemorrhoid and the band may fall off as soon as 48 hours after the banding (the band may occasionally be seen in the toilet bowl following a bowel movement). You may notice a temporary feeling of fullness in the rectum which should respond adequately to plain Tylenol or Motrin.  2. Following the banding, avoid strenuous exercise that evening and resume full activity the next day.  A sitz bath (soaking in a warm tub) or bidet is soothing, and can be useful for cleansing the area after bowel movements.     3. To avoid constipation, take two tablespoons of natural wheat bran, natural oat bran, flax, Benefiber or any over the counter fiber supplement and increase your water intake to 7-8 glasses daily.    4. Unless you have been prescribed anorectal medication, do not put anything inside your rectum for two weeks: No suppositories, enemas, fingers, etc.  5. Occasionally, you may have more bleeding than usual after the banding procedure.  This is often from the untreated hemorrhoids rather than the treated one.  Don't be concerned if there is a tablespoon or so of blood.  If there is more blood than this, lie flat with your bottom higher than your head and apply an ice pack to the area. If the bleeding  does not stop within a half an hour or if you feel faint, call our office at [phone] or go to the emergency room.  6. Problems are not common; however, if there is a substantial amount of bleeding, severe pain, chills, fever or difficulty passing urine (very rare) or other problems, you should call us at [phone] or report to the nearest emergency room.  7. Do not stay seated continuously for more than 2-3 hours for a day or two after the procedure.  Tighten your buttock muscles 10-15 times every two hours and take 10-15 deep breaths every 1-2 hours.  Do not spend more than a few minutes on the toilet if you cannot empty your bowel; instead re-visit the toilet at a later time.    Follow up for second banding on 03/16/20 at 3 pm   It was a pleasure to see you today!  Vito Cirigliano, D.O.

## 2020-03-16 ENCOUNTER — Other Ambulatory Visit: Payer: Self-pay

## 2020-03-16 ENCOUNTER — Encounter: Payer: Self-pay | Admitting: Gastroenterology

## 2020-03-16 ENCOUNTER — Ambulatory Visit (INDEPENDENT_AMBULATORY_CARE_PROVIDER_SITE_OTHER): Payer: BC Managed Care – PPO | Admitting: Gastroenterology

## 2020-03-16 VITALS — BP 112/74 | HR 67 | Ht 70.0 in | Wt 165.2 lb

## 2020-03-16 DIAGNOSIS — Z1509 Genetic susceptibility to other malignant neoplasm: Secondary | ICD-10-CM

## 2020-03-16 DIAGNOSIS — Z1501 Genetic susceptibility to malignant neoplasm of breast: Secondary | ICD-10-CM | POA: Diagnosis not present

## 2020-03-16 DIAGNOSIS — Z808 Family history of malignant neoplasm of other organs or systems: Secondary | ICD-10-CM | POA: Diagnosis not present

## 2020-03-16 DIAGNOSIS — K641 Second degree hemorrhoids: Secondary | ICD-10-CM | POA: Diagnosis not present

## 2020-03-16 DIAGNOSIS — Z800 Family history of malignant neoplasm of digestive organs: Secondary | ICD-10-CM

## 2020-03-16 DIAGNOSIS — Z1589 Genetic susceptibility to other disease: Secondary | ICD-10-CM

## 2020-03-16 NOTE — Patient Instructions (Addendum)
If you are age 46 or older, your body mass index should be between 23-30. Your Body mass index is 23.71 kg/m. If this is out of the aforementioned range listed, please consider follow up with your Primary Care Provider.  If you are age 57 or younger, your body mass index should be between 19-25. Your Body mass index is 23.71 kg/m. If this is out of the aformentioned range listed, please consider follow up with your Primary Care Provider.   Your provider has requested that you go to the basement level for lab work at [facility]. [place], [place] "B" on the Media planner. The lab is located at the first door on the left as you exit the elevator. HGB A1C in November 2021.   We will contact you regarding EUS procedure for  04/2020.  Follow up we me on 04/16/20 at 3:40 pm.  It was a pleasure to see you today!  Vito Cirigliano, D.O.

## 2020-03-16 NOTE — Progress Notes (Signed)
P  Chief Complaint:    Symptomatic Internal Hemorrhoids; Hemorrhoid Band Ligation  GI History: 44 y.o.female follows in the Gastroenterology Clinic for elevated risk PancreaticCancer screeningand Colon cancer screening. She has a known familialmutationin PALB2, with family history as outlined below. She was recently seen by Dr. Jana Hakim [facility]/high risk breast cancer clinic on 09/18/2019 and by Roma Kayser in the [facility] on 08/22/2019.  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  Pancreatic Cancer screening initiated in 10/2019: -Hemoglobin A1c (10/2019): Normal.  Repeat fasting BG every 6 months (04/2020) -MRI/MRCP (10/2019): Normal.  Repeat 10/2018. -EUS: To be scheduled 04/2020  Colon cancer screening: -Colonoscopy (12/2019): 2 benign polyps, internal hemorrhoids, otherwise normal.  Repeat 5 years  Given elevated risk for Breast Cancer, started on tamoxifen in 10/2019 with plan for intensified screening with yearly breast MRI and yearly mammography.  Follows with Dermatology given family history of Melanoma.  History of internal hemorrhoids and skin tags. -02/05/20: Successful banding of the LL internal hemorrhoid -03/16/2020: Presents for hemorrhoid banding #2  HPI:     Patient is a 44 y.o. female with a history of symptomatic internal hemorrhoids presenting to the  Gastroenterology Clinic for follow-up and ongoing treatment. The patient presents with symptomatic grade 2 hemorrhoids, unresponsive to maximal medical therapy, requesting rubber band ligation of symptomatic hemorrhoidal disease.  No issues after first hemorrhoid banding.  She additionally follows in the GI clinic for increased risk screening for Pancreatic Cancer and Colon Cancer.  Up-to-date on all screening as outlined above.    No change in medical or surgical history, medications, allergies, social history since last appointment with me.   Review of systems:     No chest pain, no SOB, no fevers, no urinary sx   Past Medical History:  Diagnosis Date  . Anemia   . Family history of breast cancer   . Family history of colon cancer   . Family history of melanoma   . Hypothyroidism     Patient's surgical history, family medical history, social history, medications and allergies were all reviewed in Epic    Current Outpatient Medications  Medication Sig Dispense Refill  . levothyroxine (SYNTHROID) 125 MCG tablet Take 125 mcg by mouth daily.    . tamoxifen (NOLVADEX) 20 MG tablet Take 1 tablet (20 mg total) by mouth daily. 90 tablet 4   No current facility-administered medications for this visit.    Physical Exam:     Ht _0  (1.778 m)   Wt 165 lb 4 oz (75 kg)   BMI 23.71 kg/m   GENERAL:  Pleasant female in NAD PSYCH: : Cooperative, normal affect NEURO: Alert and oriented x 3, no focal neurologic deficits Rectal exam: Sensation intact and preserved anal wink.  Grade 2 hemorrhoids noted in RP/RA positions.  No external anal fissures noted. Normal sphincter tone. No palpable mass. No blood on the exam glove. (Chaperone: Curlene Labrum, CMA).   IMPRESSION and PLAN:    #1.  Symptomatic internal hemorrhoids: PROCEDURE NOTE: The patient presents with symptomatic grade  2 hemorrhoids, unresponsive to maximal medical therapy, requesting rubber band ligation of symptomatic  hemorrhoidal disease.  All risks, benefits and alternative forms of therapy were described and informed consent was obtained.  In the Left Lateral Decubitus position, anoscopic examination revealed grade 2 hemorrhoids in the RP/RA position(s).  The anorectum was pre-medicated with RectiCare. The decision was made to band the RP internal hemorrhoid, and the [place] was used to perform band ligation without complication.  Digital anorectal examination was then performed to assure proper positioning of the band, and to adjust the banded tissue as required.  The patient was discharged home without pain or other issues.  Dietary and behavioral recommendations were given and along with follow-up instructions.     The following adjunctive treatments were recommended:  -Resume high-fiber diet with fiber supplement (i.e. Citrucel or Benefiber) with goal for soft stools without straining to have a BM. -Resume adequate fluid intake.  The patient will return in 4 for  follow-up and possible additional banding as required. No complications were encountered and the patient tolerated the procedure well.  2)PALB2 Genemutation carrier: 3) Cystic fibrosis gene carrier  Known mutation in thePALB2gene, and recent genetic testing also identifying single, heterozygous pathogenic PALB68mtation. Additionally, she is a cystic fibrosis gene carrier. Otherwise, no known family history of pancreatic CA, and no personal history of pancreatitis.  Based on having two PC predisposing genetic mutations, initiated Pancreatic Cancer screening in 10/2019.  -UTD on screening -Schedule EUS for 04/2020 -Repeat MRI/MRCP 10/2020 -Check fasting blood glucose and/or hemoglobin A1c every 6 months (04/2020) -Screening interval every651monthas abovein patients with no abnormalities/nonconcerning abnormalities.  Modification to screening intervals/modalities if abnormal findings (see my note from 09/2019) -New-onset  diabetes in a high-risk individual should lead to additional diagnostic studies or change in surveillance interval -Following in the Oncology clinic  4) Family history of colon cancer -Sister diagnosed with colon cancer around age 5717Colonoscopy unremarkable in 2021.  Repeat in 2026 (5 years)  5) Family history ofMelanoma: -Following in the Dermatology Clinic  6) Increased risk for breast cancer -Follows with Dr. MaJana Hakimith intensified screening and started on tamoxifen  I spent 20 minutes of additional nonprocedural time, including independent review of results as outlined above, communicating results with the patient directly, face-to-face time with the patient, coordinating care, ordering studies and medications as appropriate, and documentation.         ViLavena BullionDO, FACG 03/16/2020, 3:09 PM

## 2020-03-17 ENCOUNTER — Telehealth: Payer: Self-pay

## 2020-03-17 NOTE — Telephone Encounter (Signed)
-----   Message from Irving Copas., MD sent at 03/17/2020  8:28 AM EDT ----- Regarding: RE: eus Dymphna Wadley, Patient is part of high-risk screening cohort. Please schedule with DJ or myself in November if available otherwise very early December. EGD/EUS Linear/Radial. Thanks. GM ----- Message ----- From: Timothy Lasso, RN Sent: 03/16/2020   4:33 PM EDT To: Lowell Guitar, RMA, # Subject: RE: eus                                        I will send to Dr Rush Landmark for review.  ----- Message ----- From: Lowell Guitar, RMA Sent: 03/16/2020   4:24 PM EDT To: Timothy Lasso, RN Subject: eus                                            Per Dr. Cathleen Corti patient needs EUS in 11/21 with Dr. Rush Landmark or Dr. Ardis Hughs for pancreatic cancer screening.  thanks

## 2020-03-24 ENCOUNTER — Other Ambulatory Visit: Payer: Self-pay

## 2020-03-24 DIAGNOSIS — Z1509 Genetic susceptibility to other malignant neoplasm: Secondary | ICD-10-CM

## 2020-03-24 NOTE — Telephone Encounter (Signed)
The pt has been scheduled for a colon on 04/16/20 at 830 am at [facility] with Dr Ardis Hughs.  COVID test on 04/13/20 at 920 am.  Left message on machine to call back instructions mailed

## 2020-03-25 NOTE — Telephone Encounter (Signed)
Left message on machine to call back  

## 2020-03-26 NOTE — Telephone Encounter (Signed)
Left message on machine to call back  

## 2020-03-27 NOTE — Telephone Encounter (Signed)
Left message on machine to call back  

## 2020-03-30 NOTE — Telephone Encounter (Signed)
I have tried to reach the pt by phone on several occasions with no response.  I have mailed all the information to the home.

## 2020-04-13 ENCOUNTER — Other Ambulatory Visit (HOSPITAL_COMMUNITY): Payer: BC Managed Care – PPO

## 2020-04-14 ENCOUNTER — Telehealth: Payer: Self-pay | Admitting: Gastroenterology

## 2020-04-14 NOTE — Telephone Encounter (Signed)
Patient called requesting to cancel the hospital procedure coming up

## 2020-04-15 NOTE — Telephone Encounter (Signed)
FYI the pt cancelled for tomorrow

## 2020-04-15 NOTE — Telephone Encounter (Signed)
The pt confirmed that she does wish to cancel procedure and will call back when she is ready to reschedule.  She states her scheduled is too full at this time.

## 2020-04-15 NOTE — Telephone Encounter (Signed)
Left message on machine to call back  

## 2020-04-16 ENCOUNTER — Encounter (HOSPITAL_COMMUNITY): Admission: RE | Payer: Self-pay | Source: Home / Self Care

## 2020-04-16 ENCOUNTER — Ambulatory Visit (HOSPITAL_COMMUNITY)
Admission: RE | Admit: 2020-04-16 | Payer: BC Managed Care – PPO | Source: Home / Self Care | Admitting: Gastroenterology

## 2020-04-16 ENCOUNTER — Encounter: Payer: BC Managed Care – PPO | Admitting: Gastroenterology

## 2020-04-16 SURGERY — UPPER ENDOSCOPIC ULTRASOUND (EUS) RADIAL
Anesthesia: Monitor Anesthesia Care

## 2020-10-12 NOTE — Progress Notes (Signed)
[employer]  [phone] [phone]     ID: CIELO ARIAS DOB: [birth year]  MR#: [id number]  JHE#:[id number]  Patient Care Team: Jonathon Jordan, MD as PCP - General (Family Medicine) Romney Antolin, Virgie Dad, MD as Consulting Physician (Oncology) Lavena Bullion, DO as Consulting Physician (Gastroenterology) Delrae Rend, MD as Consulting Physician (Endocrinology) Orbie Hurst, MD as Referring Physician (Dermatology) Chauncey Cruel, MD OTHER MD:  CHIEF COMPLAINT: high risk for breast cancer; PALB2+  CURRENT TREATMENT: Considering alternatives   INTERVAL HISTORY: Suzanne Kerr was scheduledtoday for follow up of her high risk for breast cancer.  However she did not show.     REVIEW OF SYSTEMS: Angelyne    COVID 42 VACCINATION STATUS:    HISTORY OF CURRENT ILLNESS: From the original intake note:  FALICITY SHEETS has a known family mutation in [place].  Her family history includes breast cancer, colon cancer, and melanoma.  There is no family history of ovarian or pancreatic cancers.  Deanna is also a carrier for cystic fibrosis.  She does not have a history of pancreatitis.  Jamaya underwent genetic counseling on 11/13/2018. Her results showed a positive PALB2 mutation but not the WT1 variant of uncertain significance noted in her sister.  She is here today to discuss her options given the increased risk of breast and other cancers associated with PALB2 mutations   PAST MEDICAL HISTORY: Past Medical History:  Diagnosis Date  . Anemia   . Family history of breast cancer   . Family history of colon cancer   . Family history of melanoma   . Hypothyroidism   History of Graves' disease, treated with radioactive iodine, currently hypothyroid   PAST SURGICAL HISTORY: Past Surgical History:  Procedure Laterality Date  . UTERINE FIBROID SURGERY  2018    FAMILY HISTORY: Family History  Problem Relation Age of Onset  . Breast cancer Mother 82  . Heart  disease Father   . Colon polyps Father   . Breast cancer Sister 48       PALB2+  . Colon cancer Sister 11  . Thyroid cancer Sister        early 78s  . Colon polyps Sister   . Colon polyps Brother   . Stroke Maternal Grandmother   . Melanoma Maternal Grandfather   . Heart disease Paternal Grandfather   . Breast cancer Other        MGMs sister; post-menopausal cancer  . Esophageal cancer Neg Hx   . Rectal cancer Neg Hx   . Stomach cancer Neg Hx   Thepatient'sfather died of heart disease. He was an only child. His parents are deceased. His mother died at 1 from old age, and his father died of heart disease. The patient's mother had breast cancer in her late40'sand died at 42 from a recurrence. She has two brothers who are cancer free. Her father had melanoma and her mother died of a stroke. The patient's maternal grandmother had 78 sisters, one had breast cancer post-menopause.  The patient has a sister with breast cancer at 61, colon cancer at 73 and probable thyroid cancer in her early 26's.  The patient has 1 brother who is cancer free.   GYNECOLOGIC HISTORY:  No LMP recorded. Menarche: [age] years old Age at first live birth: 45 years old old [place] P1 LMP regular, ongoing Contraceptive: never used HRT n/a  Hysterectomy? no BSO? 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  Her husband Shanon Brow is a Barista.  Their daughter, Colletta [place], is 33 as of April 2021.    ADVANCED DIRECTIVES: In the absence of any documents to the contrary the patient's husband is her healthcare power of attorney   HEALTH MAINTENANCE: Social History   Tobacco Use  . Smoking status: Never Smoker  . Smokeless tobacco: Never Used  Vaping Use  . Vaping Use: Never used  Substance Use Topics  . Alcohol use: Not Currently  . Drug use: Never     Colonoscopy:   PAP:   Bone density:   Allergies  Allergen Reactions  . Tranexamic Acid Rash     Stomach upset    Current Outpatient Medications  Medication Sig Dispense Refill  . levothyroxine (SYNTHROID) 125 MCG tablet Take 125 mcg by mouth daily.    . tamoxifen (NOLVADEX) 20 MG tablet Take 1 tablet (20 mg total) by mouth daily. 90 tablet 4   No current facility-administered medications for this visit.    OBJECTIVE: White woman contacted by phone  There were no vitals filed for this visit.   There is no height or weight on file to calculate BMI.   Wt Readings from Last 3 Encounters:  03/16/20 165 lb 4 oz (75 kg)  02/05/20 163 lb 6 oz (74.1 kg)  12/13/19 167 lb (75.8 kg)      ECOG FS:0 - Asymptomatic   LAB RESULTS:  CMP  No results found for: NA, K, CL, CO2, GLUCOSE, BUN, CREATININE, CALCIUM, PROT, ALBUMIN, AST, ALT, ALKPHOS, BILITOT, GFRNONAA, GFRAA  No results found for: TOTALPROTELP, ALBUMINELP, A1GS, A2GS, BETS, BETA2SER, GAMS, MSPIKE, SPEI  Lab Results  Component Value Date   WBC 4.8 05/05/2008   HGB 13.9 05/05/2008   HCT 41.6 05/05/2008   MCV 86.1 05/05/2008   PLT 223 05/05/2008    No results found for: LABCA2  No components found for: [id number]  No results for input(s): INR in the last 168 hours.  No results found for: LABCA2  No results found for: ZHY865  No results found for: HQI696  No results found for: EXB284  No results found for: CA2729  No components found for: HGQUANT  No results found for: CEA1 / No results found for: CEA1   No results found for: AFPTUMOR  No results found for: CHROMOGRNA  No results found for: KPAFRELGTCHN, LAMBDASER, KAPLAMBRATIO (kappa/lambda light chains)  No results found for: HGBA, HGBA2QUANT, HGBFQUANT, HGBSQUAN (Hemoglobinopathy evaluation)   No results found for: LDH  No results found for: IRON, TIBC, IRONPCTSAT (Iron and TIBC)  No results found for: FERRITIN  Urinalysis No results found for: COLORURINE, APPEARANCEUR, LABSPEC, PHURINE, GLUCOSEU, HGBUR, BILIRUBINUR, KETONESUR, PROTEINUR,  UROBILINOGEN, NITRITE, LEUKOCYTESUR   STUDIES: No results found.   ELIGIBLE FOR AVAILABLE RESEARCH PROTOCOL:AET  ASSESSMENT: 45 y.o. 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 and a family history of breast, and colon cancers and melanoma, no family history of ovarian or pancreatic cancers  (a) patient is also a cystic fibrosis carrier  (1) intensified screening for breast cancer:  (a) breast MRI yearly, initiated 10/10/2019  (b) mammography yearly, next due November 2021  (2) breast cancer risk reduction:  (a) started tamoxifen 10/19/2019  (b) considering bilateral mastectomies with reconstruction  (3) pancreatic cancer screening (per Dr Bryan Lemma)  (a) implemented May 2021 and ff  (3) other cancer screening based on family history:  (a) yearly dermatology screen for melanoma  (b) colonoscopy 12/13/2019 and every 5 years  PLAN: Maxene did not show for her 10/13/2020 visit.  A follow-up letter has been sent  Sarajane Jews C. Laurenashley Viar, MD 10/13/2020 4:46 PM Medical Oncology and Hematology [facility] [place], [place] [address] Tel. [phone]    Fax. [phone]   This document serves as a record of services personally performed by Lurline Del, MD. It was created on his behalf by Wilburn Mylar, a trained medical scribe. The creation of this record is based on the scribe's personal observations and the provider's statements to them.   I, Lurline Del MD, have reviewed the above documentation for accuracy and completeness, and I agree with the above.   *Total Encounter Time as defined by the Centers for Medicare and Medicaid Services includes, in addition to the face-to-face time of a patient visit (documented in the note above) non-face-to-face time: obtaining and reviewing outside history, ordering and reviewing medications, tests or procedures, care coordination (communications with other health care  professionals or caregivers) and documentation in the medical record.

## 2020-10-13 ENCOUNTER — Encounter: Payer: Self-pay | Admitting: Oncology

## 2020-10-13 ENCOUNTER — Inpatient Hospital Stay: Payer: BC Managed Care – PPO | Attending: Oncology | Admitting: Oncology

## 2020-10-13 DIAGNOSIS — Z1589 Genetic susceptibility to other disease: Secondary | ICD-10-CM

## 2020-10-13 DIAGNOSIS — Z803 Family history of malignant neoplasm of breast: Secondary | ICD-10-CM

## 2020-10-13 DIAGNOSIS — Z1501 Genetic susceptibility to malignant neoplasm of breast: Secondary | ICD-10-CM

## 2020-10-13 DIAGNOSIS — Z1509 Genetic susceptibility to other malignant neoplasm: Secondary | ICD-10-CM

## 2020-11-25 ENCOUNTER — Ambulatory Visit
Admission: RE | Admit: 2020-11-25 | Discharge: 2020-11-25 | Disposition: A | Discharge status: Home / Self Care | Payer: BC Managed Care – PPO | Source: Ambulatory Visit | Attending: Oncology | Admitting: Oncology

## 2020-11-25 ENCOUNTER — Other Ambulatory Visit: Payer: Self-pay

## 2020-11-25 DIAGNOSIS — Z1509 Genetic susceptibility to other malignant neoplasm: Secondary | ICD-10-CM

## 2020-11-25 IMAGING — MR MR BREAST BILAT WO/W CM
8 of 12 series · 30 of 48 positions shown · IV contrast (7 mL Gadavist)
Comparison: [DATE] and earlier
COMPARISON: [DATE] and earlier
COMPARISON: [DATE] and earlier

Addendum:
CLINICAL DATA: Breast cancer screening. Monoallelic mutation of
[MI] 2 gene. Patient's mother and sister were diagnosed with
premenopausal breast cancer.

LABS:  None obtained at the time of imaging.
EXAM:
BILATERAL BREAST MRI WITH AND WITHOUT CONTRAST
TECHNIQUE: Multiplanar, multisequence MR images of both breasts were obtained
prior to and following the intravenous administration of 7 ml of
Gadavist
CLINICAL DATA: Breast cancer screening. Monoallelic mutation of [MI] 2 gene.
Patient's mother and sister were diagnosed with premenopausal breast
cancer.
LABS:
None obtained at the time of imaging.

[Series 2: t2_tirm_tra ipat (a-p) · axial · 3.0mm · 0.70mm/px · 1 of 56 slices shown]
[im 1/56]
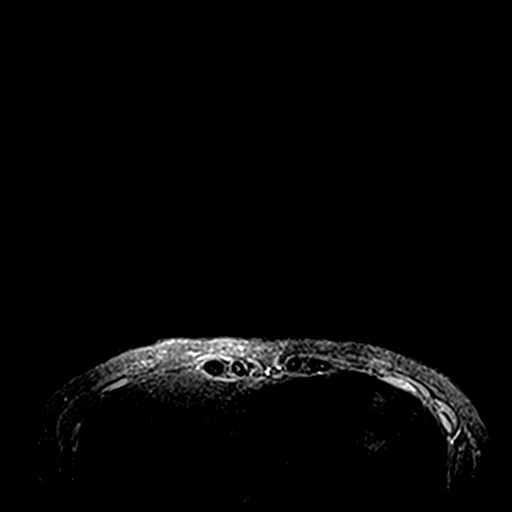

[Series 3: fl3d pre-cm no · axial · non-contrast · 1.2mm · 0.94mm/px · z∈[-110,+62]mm · 5 of 144 slices shown]
[im 1/144]
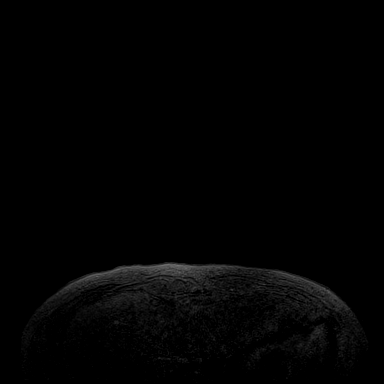
[im 36/144]
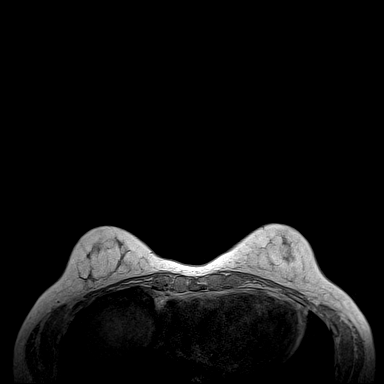
[im 72/144]
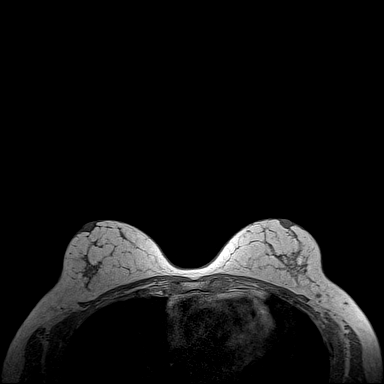
[im 108/144]
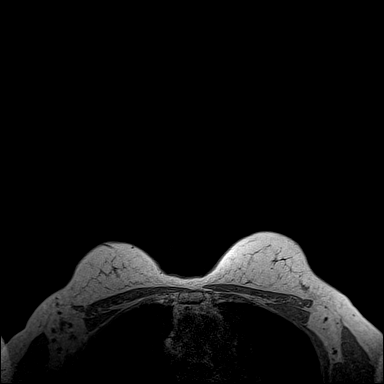
[im 144/144]
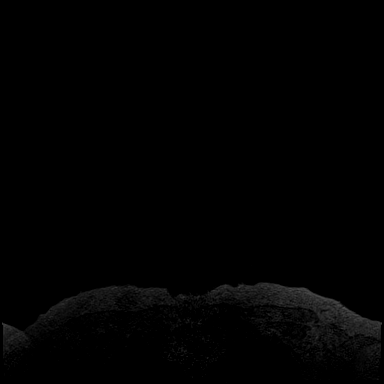

[Series 4: fl3d pre-cm · axial · non-contrast · 1.2mm · 0.94mm/px · z∈[-110,+62]mm · 5 of 144 slices shown]
[im 1/144]
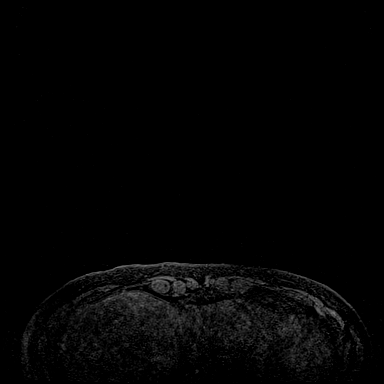
[im 36/144]
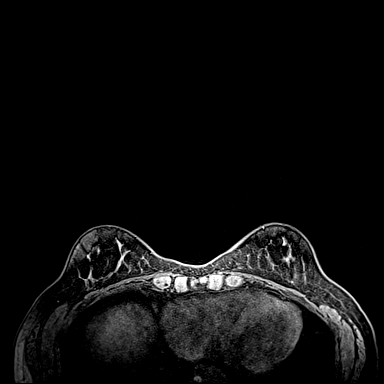
[im 72/144]
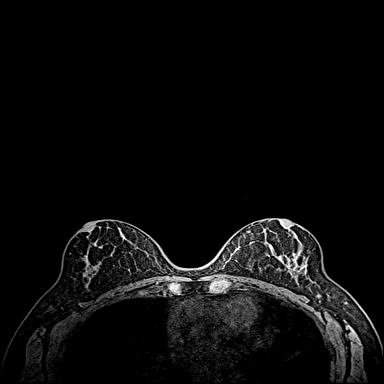
[im 108/144]
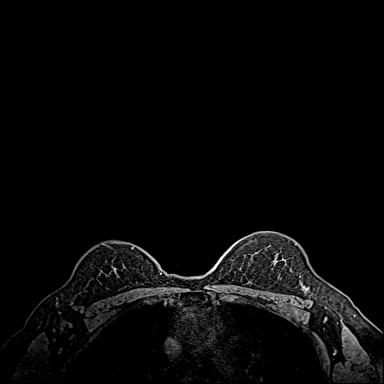
[im 144/144]
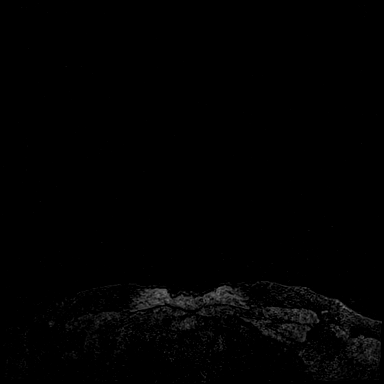

[Series 5: fl3d post immediate · axial · 1.2mm · 0.94mm/px · z∈[-110,+62]mm · 5 of 144 slices shown (1 of 3)]
[im 1/144]
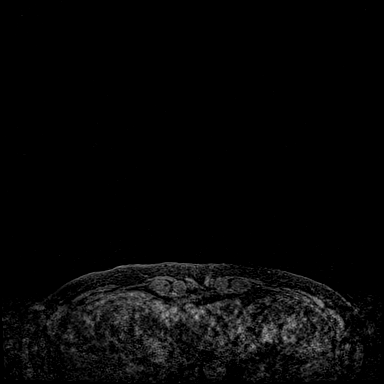
[im 36/144]
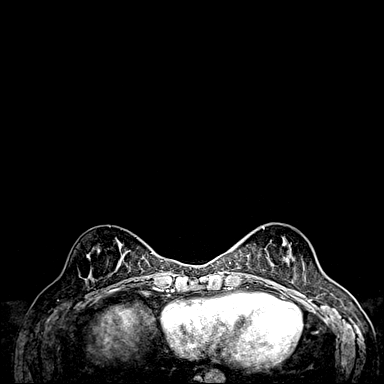
[im 72/144]
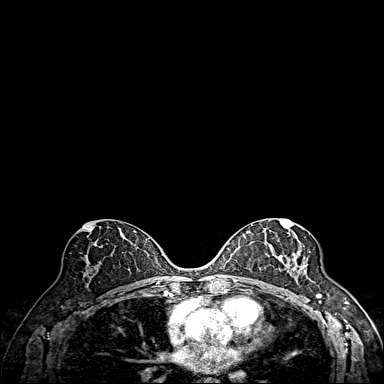
[im 108/144]
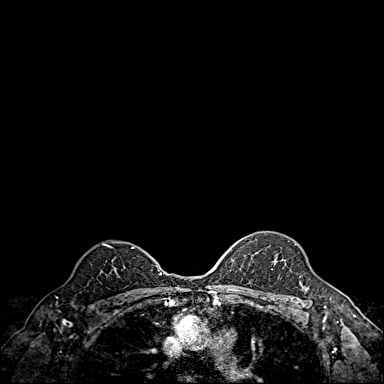
[im 144/144]
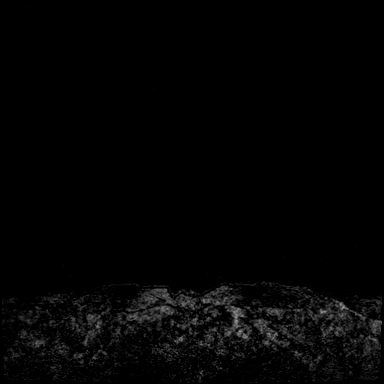

[Series 6: fl3d post immediate · axial · 1.2mm · 0.94mm/px · z∈[-110,+62]mm · 5 of 144 slices shown (2 of 3)]
[im 1/144]
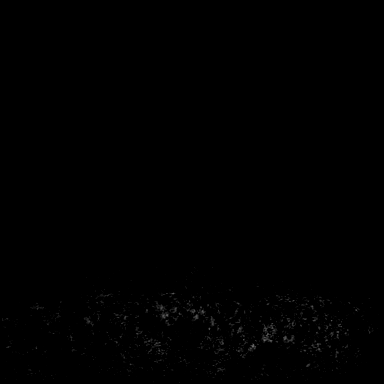
[im 36/144]
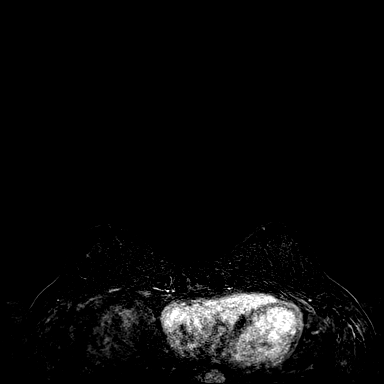
[im 72/144]
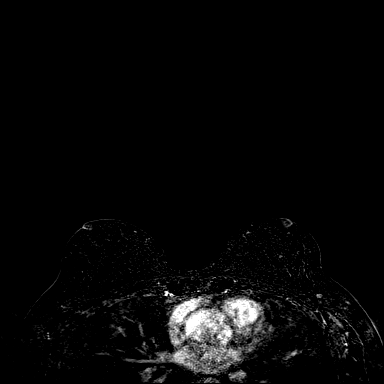
[im 108/144]
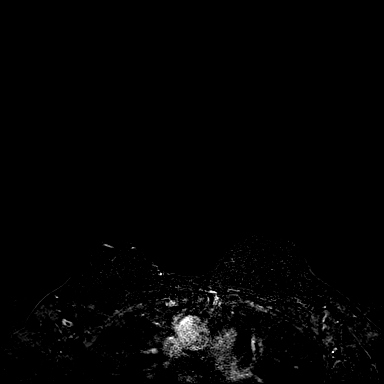
[im 144/144]
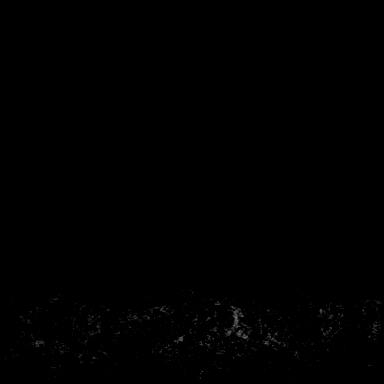

[Series 7: fl3d post immediate · axial · 172.8mm · 0.94mm/px · 1 of 1 slices shown (3 of 3)]
[im 1/1]
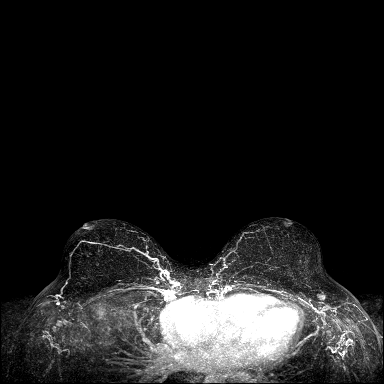

[Series 8: fl3d post 3min · axial · 1.2mm · 0.94mm/px · z∈[-110,+62]mm · 6 of 144 slices shown]
[im 1/144]
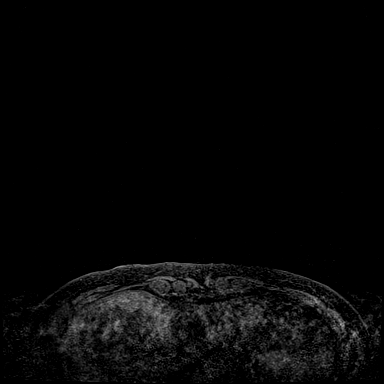
[im 29/144]
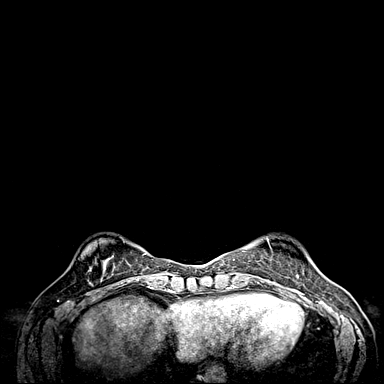
[im 58/144]
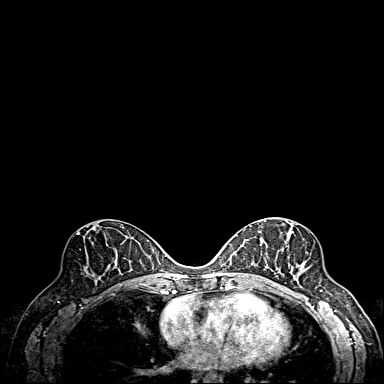
[im 86/144]
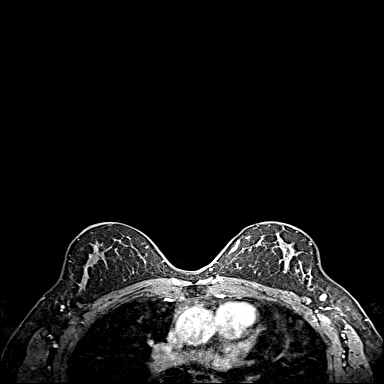
[im 115/144]
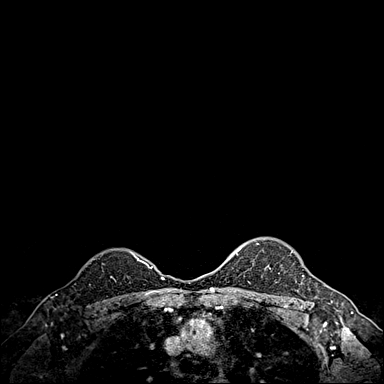
[im 144/144]
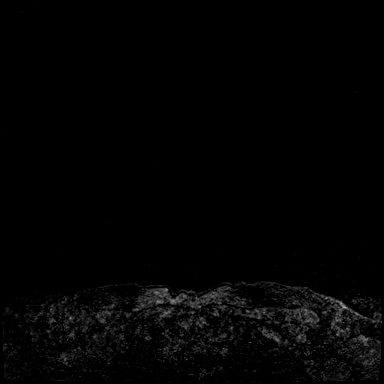

[Series 9: fl3d post 3min_sub · axial · 1.2mm · 0.94mm/px · z∈[-110,-76]mm · 2 of 144 slices shown]
[im 1/144]
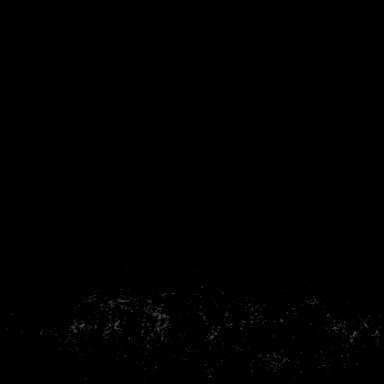
[im 29/144]
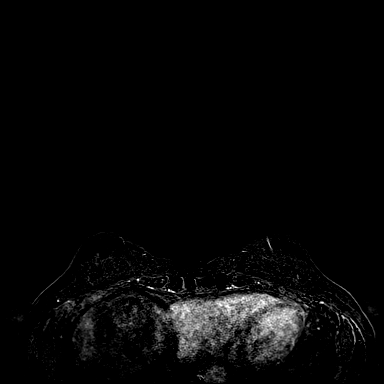

[30 of 48 positions shown; findings below may reference images not displayed]

Three-dimensional MR images were rendered by post-processing of the
original MR data on an independent workstation. The
three-dimensional MR images were interpreted, and findings are
reported in the following complete MRI report for this study. Three
dimensional images were evaluated at the independent interpreting
workstation using the DynaCAD thin client.
FINDINGS: Breast composition: b. Scattered fibroglandular tissue.

Background parenchymal enhancement: Minimal

Right breast: No mass or abnormal enhancement.

Left breast: No mass or abnormal enhancement.

Lymph nodes: No abnormal appearing lymph nodes.

Ancillary findings:  None.
IMPRESSION: No MRI evidence for malignancy.

RECOMMENDATION:
Based on the recommendations of the American Cancer Society, annual
screening MRI is suggested in addition to annual mammography if the
patient has an estimated lifetime risk of developing breast cancer
which is greater than 20%.

Recommend screening mammogram in [DATE].

BI-RADS CATEGORY  1: Negative.

ADDENDUM:
Three-dimensional MR images were rendered by post-processing of the
original MR data on an independent workstation. The
three-dimensional MR images were interpreted, and findings are
reported in the following complete MRI report for this study. Three
dimensional images were evaluated at the independent interpreting
workstation using the DynaCAD thin client.
FINDINGS: Breast composition: b. Scattered fibroglandular tissue.

Background parenchymal enhancement: Minimal

Right breast: No mass or abnormal enhancement.

Left breast: No mass or abnormal enhancement.

Lymph nodes: No abnormal appearing lymph nodes.

Ancillary findings: None.
IMPRESSION: No MRI evidence for malignancy.

RECOMMENDATION:

Based on the recommendations of the American Cancer Society, annual
screening MRI is suggested in addition to annual mammography if the
patient has an estimated lifetime risk of developing breast cancer
which is greater than 20%.

Recommend screening mammogram in [DATE].

BI-RADS CATEGORY:

1: Negative.

*** End of Addendum ***
Three-dimensional MR images were rendered by post-processing of the
original MR data on an independent workstation. The
three-dimensional MR images were interpreted, and findings are
reported in the following complete MRI report for this study. Three
dimensional images were evaluated at the independent interpreting
workstation using the DynaCAD thin client.
FINDINGS: Breast composition: b. Scattered fibroglandular tissue.

Background parenchymal enhancement: Minimal

Right breast: No mass or abnormal enhancement.

Left breast: No mass or abnormal enhancement.

Lymph nodes: No abnormal appearing lymph nodes.

Ancillary findings:  None.
IMPRESSION: No MRI evidence for malignancy.

RECOMMENDATION:
Based on the recommendations of the American Cancer Society, annual
screening MRI is suggested in addition to annual mammography if the
patient has an estimated lifetime risk of developing breast cancer
which is greater than 20%.

Recommend screening mammogram in [DATE].

BI-RADS CATEGORY  1: Negative.

## 2020-11-25 MED ORDER — GADOBUTROL 1 MMOL/ML IV SOLN
7.0000 mL | Freq: Once | INTRAVENOUS | Status: AC | PRN
  Administered 2020-11-25: 11:00:00 7 mL via INTRAVENOUS

## 2021-01-01 ENCOUNTER — Other Ambulatory Visit: Payer: Self-pay | Admitting: Oncology

## 2021-03-02 ENCOUNTER — Telehealth: Payer: Self-pay

## 2021-03-02 NOTE — Telephone Encounter (Signed)
-----   Message from Irving Copas., MD sent at 03/02/2021  3:30 PM EDT ----- Regarding: High risk pancreas cancer screening cohort Suzanne Kerr, This patient canceled her EUS in 2021 and never rescheduled. Would be reasonable to reach out to her when able to see if she wants to move forward with her EUS. If she does not, then our screening for pancreas cancer is not going to be effective if we are not following a protocol. She certainly can talk with Dr. Bryan Lemma in follow-up if she wants to but just want to see if she is interested in getting rescheduled. Please update Dr. Bryan Lemma and DJ or myself (whomever gets scheduled with her procedure). Thanks. GM

## 2021-03-04 NOTE — Telephone Encounter (Signed)
Left message on machine to call back  

## 2021-03-05 NOTE — Telephone Encounter (Signed)
I spoke with the pt and she does not want to reschedule at this time.  She states that if/when she wishes to proceed she will contact our office.

## 2022-01-13 ENCOUNTER — Other Ambulatory Visit: Payer: Self-pay | Admitting: *Deleted

## 2022-01-13 MED ORDER — TAMOXIFEN CITRATE 20 MG PO TABS: 20.0000 mg | ORAL_TABLET | Freq: Every day | ORAL | 4 refills | Status: DC

## 2022-01-19 ENCOUNTER — Telehealth: Payer: Self-pay | Admitting: Hematology and Oncology

## 2022-01-19 NOTE — Telephone Encounter (Signed)
Contacted patient to scheduled appointments. Left message with appointment details and a call back number if patient had any questions or could not accommodate the time we provided.   

## 2022-02-24 ENCOUNTER — Inpatient Hospital Stay: Payer: BC Managed Care – PPO | Attending: Hematology and Oncology | Admitting: Hematology and Oncology

## 2023-03-29 ENCOUNTER — Other Ambulatory Visit: Payer: Self-pay | Admitting: Hematology and Oncology

## 2023-03-31 ENCOUNTER — Other Ambulatory Visit: Payer: Self-pay | Admitting: *Deleted

## 2023-03-31 MED ORDER — TAMOXIFEN CITRATE 20 MG PO TABS: 20.0000 mg | ORAL_TABLET | Freq: Every day | ORAL | 0 refills | Status: DC

## 2023-04-20 ENCOUNTER — Inpatient Hospital Stay: Payer: BC Managed Care – PPO | Attending: Hematology and Oncology | Admitting: Hematology and Oncology

## 2023-04-20 ENCOUNTER — Encounter: Payer: Self-pay | Admitting: Hematology and Oncology

## 2023-04-20 VITALS — BP 122/61 | HR 66 | Temp 98.2°F | Resp 16 | Wt 171.9 lb

## 2023-04-20 DIAGNOSIS — D649 Anemia, unspecified: Secondary | ICD-10-CM | POA: Diagnosis not present

## 2023-04-20 DIAGNOSIS — Z800 Family history of malignant neoplasm of digestive organs: Secondary | ICD-10-CM | POA: Insufficient documentation

## 2023-04-20 DIAGNOSIS — Z79810 Long term (current) use of selective estrogen receptor modulators (SERMs): Secondary | ICD-10-CM | POA: Diagnosis not present

## 2023-04-20 DIAGNOSIS — E039 Hypothyroidism, unspecified: Secondary | ICD-10-CM | POA: Insufficient documentation

## 2023-04-20 DIAGNOSIS — Z1589 Genetic susceptibility to other disease: Secondary | ICD-10-CM | POA: Diagnosis not present

## 2023-04-20 DIAGNOSIS — Z1509 Genetic susceptibility to other malignant neoplasm: Secondary | ICD-10-CM | POA: Diagnosis not present

## 2023-04-20 DIAGNOSIS — Z803 Family history of malignant neoplasm of breast: Secondary | ICD-10-CM | POA: Diagnosis not present

## 2023-04-20 DIAGNOSIS — Z1501 Genetic susceptibility to malignant neoplasm of breast: Secondary | ICD-10-CM | POA: Diagnosis not present

## 2023-04-20 NOTE — Progress Notes (Signed)
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 Contois was last seen by Dr. Darnelle Catalan in 2021.  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  She also continues on tamoxifen since 2021 and wants to continue this for a while beyond 5-year mark. She has seen GI back in 2021 had a one-time MRI abdomen, has no family history of pancreatic cancer hence has not been doing annual MRI abdomen's. She tells me that this place makes her extremely anxious especially because she lost her mom to breast cancer.  She is happy her sister is doing well but she is very anxious about coming here.  She would like to get back on MRI screening, but would prefer to do it in months or more.  She had her last mammogram at [facility] and she is due for another one in February.  According to the patient, his mammograms have been fine. She denies any breast changes.  She has been taking tamoxifen as instructed and denies any adverse effects at all.  She is very diligent taking it. No skin abnormalities.  No change in bowel habits.  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  Her family history includes breast cancer, colon cancer, and melanoma.  There is no  family history of ovarian or pancreatic cancers.  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  She does not have a history of pancreatitis.  Jayde underwent genetic counseling on 11/13/2018. Her results showed a positive PALB2 mutation but not the WT1 variant of uncertain significance noted in her sister.  She is here today to discuss her options given the increased risk of breast and other cancers associated with PALB2 mutations   PAST MEDICAL HISTORY: Past Medical History:  Diagnosis Date   Anemia    Family history of breast cancer    Family history of colon cancer    Family history of melanoma    Hypothyroidism   History of Graves' disease, treated with radioactive iodine, currently hypothyroid   PAST SURGICAL HISTORY: Past Surgical History:  Procedure Laterality Date   UTERINE FIBROID SURGERY  2018    FAMILY HISTORY: Family History  Problem Relation Age of Onset   Breast cancer Mother 50   Heart disease Father    Colon polyps Father    Breast cancer Sister 85       PALB2+   Colon cancer Sister 30   Thyroid cancer Sister        early 82s   Colon polyps Sister    Colon polyps Brother    Stroke Maternal Grandmother    Melanoma Maternal Grandfather    Heart disease Paternal Grandfather    Breast cancer Other        MGMs sister; post-menopausal cancer   Esophageal cancer Neg  Hx    Rectal cancer Neg Hx    Stomach cancer Neg Hx   The patient's father died of heart disease.  He was an only child.  His parents are deceased.  His mother died at 20 from old age, and his father died of heart disease.  The patient's mother had breast cancer in her late 32's and died at 5 from a recurrence. She has two brothers who are cancer free.  Her father had melanoma and her mother died of a stroke.  The patient's maternal grandmother had 8 sisters, one had breast cancer post-menopause.  The patient has a sister with breast cancer at 23, colon cancer at 23 and probable thyroid cancer in her  early 69's.  The patient has 1 brother who is cancer free.   GYNECOLOGIC HISTORY:  No LMP recorded. Menarche: [age] years old Age at first live birth: 47 years old GX P1 LMP regular, ongoing Contraceptive: never used HRT n/a  Hysterectomy? no BSO? 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  Her husband Onalee Hua is a Oncologist.  Their daughter, Judeth Cornfield, is 33 as of April 2021.    ADVANCED DIRECTIVES: In the absence of any documents to the contrary the patient's husband is her healthcare power of attorney   HEALTH MAINTENANCE: Social History   Tobacco Use   Smoking status: Never   Smokeless tobacco: Never  Vaping Use   Vaping status: Never Used  Substance Use Topics   Alcohol use: Not Currently   Drug use: Never     Colonoscopy:   PAP:   Bone density:   Allergies  Allergen Reactions   Tranexamic Acid Rash    Stomach upset    Current Outpatient Medications  Medication Sig Dispense Refill   levothyroxine (SYNTHROID) 125 MCG tablet Take 125 mcg by mouth daily.     tamoxifen (NOLVADEX) 20 MG tablet Take 1 tablet (20 mg total) by mouth daily. 90 tablet 0   No current facility-administered medications for this visit.    OBJECTIVE: White woman contacted by phone  Bilateral breasts inspected.  No palpable masses, no regional adenopathy Chest: Clear to auscultation bilaterally Heart: Rate and rhythm regular No lower extremity edema  Vitals:   04/20/23 1524  BP: 122/61  Pulse: 66  Resp: 16  Temp: 98.2 F (36.8 C)  SpO2: 100%     Body mass index is 24.67 kg/m.   Wt Readings from Last 3 Encounters:  04/20/23 171 lb 14.4 oz (78 kg)  03/16/20 165 lb 4 oz (75 kg)  02/05/20 163 lb 6 oz (74.1 kg)      ECOG FS:0 - Asymptomatic   LAB RESULTS:  CMP  No results found for: "NA", "K", "CL", "CO2", "GLUCOSE", "BUN", "CREATININE", "CALCIUM", "PROT", "ALBUMIN", "AST", "ALT", "ALKPHOS", "BILITOT", "GFRNONAA",  "GFRAA"  No results found for: "TOTALPROTELP", "ALBUMINELP", "A1GS", "A2GS", "BETS", "BETA2SER", "GAMS", "MSPIKE", "SPEI"  Lab Results  Component Value Date   WBC 4.8 05/05/2008   HGB 13.9 05/05/2008   HCT 41.6 05/05/2008   MCV 86.1 05/05/2008   PLT 223 05/05/2008    No results found for: "LABCA2"  No components found for: "BMWUXL244"  No results for input(s): "INR" in the last 168 hours.  No results found for: "LABCA2"  No results found for: "WNU272"  No results found for: "CAN125"  No results found for: "CAN153"  No results found for: "CA2729"  No components found for: "HGQUANT"  No  results found for: "CEA1", "CEA" / No results found for: "CEA1", "CEA"   No results found for: "AFPTUMOR"  No results found for: "CHROMOGRNA"  No results found for: "KPAFRELGTCHN", "LAMBDASER", "KAPLAMBRATIO" (kappa/lambda light chains)  No results found for: "HGBA", "HGBA2QUANT", "HGBFQUANT", "HGBSQUAN" (Hemoglobinopathy evaluation)   No results found for: "LDH"  No results found for: "IRON", "TIBC", "IRONPCTSAT" (Iron and TIBC)  No results found for: "FERRITIN"  Urinalysis No results found for: "COLORURINE", "APPEARANCEUR", "LABSPEC", "PHURINE", "GLUCOSEU", "HGBUR", "BILIRUBINUR", "KETONESUR", "PROTEINUR", "UROBILINOGEN", "NITRITE", "LEUKOCYTESUR"   STUDIES: No results found.   ELIGIBLE FOR AVAILABLE RESEARCH PROTOCOL:AET  ASSESSMENT: 47 y.o. 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 and a family history of breast, and colon cancers and melanoma, no family history of ovarian or pancreatic cancers  (a) patient is also a cystic fibrosis carrier  (1) intensified screening for breast cancer:  (a) breast MRI yearly, initiated 10/10/2019  (b) mammography yearly, next due November 2021  (2) breast cancer risk reduction:  (a) started tamoxifen 10/19/2019  (b) considering bilateral mastectomies with reconstruction  (3) pancreatic cancer  screening (per Dr Barron Alvine)  (a) implemented May 2021 and   (3) other cancer screening based on family history:  (a) yearly dermatology screen for melanoma  (b) colonoscopy 12/13/2019 and every 5 years   PLAN:  Patient would like to continue intensive cancer screening.  No concerns on physical exam.  She will proceed with MRI in summer per patient preference, mammogram in February.  Once again I have encouraged her to keep up with colonoscopy every 5 years given family history.  There is no family history of pancreatic cancer hence I do not believe she will need an annual MRI abdomen for pancreatic cancer screening.  Yearly dermatology visit recommended and she is up-to-date.  Overall she would like to continue tamoxifen beyond 5 years even for breast cancer prevention.  At this time she is not interested in breast surgery.  Return to clinic in 1 year or sooner as needed.  Total time: 30 min *Total Encounter Time as defined by the Centers for Medicare and Medicaid Services includes, in addition to the face-to-face time of a patient visit (documented in the note above) non-face-to-face time: obtaining and reviewing outside history, ordering and reviewing medications, tests or procedures, care coordination (communications with other health care professionals or caregivers) and documentation in the medical record.

## 2023-06-25 ENCOUNTER — Other Ambulatory Visit: Payer: Self-pay | Admitting: Hematology and Oncology

## 2023-09-22 ENCOUNTER — Other Ambulatory Visit: Payer: Self-pay | Admitting: Hematology and Oncology

## 2023-12-17 ENCOUNTER — Other Ambulatory Visit: Payer: Self-pay | Admitting: Hematology and Oncology

## 2024-03-11 ENCOUNTER — Other Ambulatory Visit: Payer: Self-pay | Admitting: Hematology and Oncology

## 2024-03-25 ENCOUNTER — Telehealth: Payer: Self-pay | Admitting: Hematology and Oncology

## 2024-03-25 NOTE — Telephone Encounter (Signed)
 Left voicemail for patient regarding rescheduled appointment from 04/22/2024 to 04/26/2024.

## 2024-04-22 ENCOUNTER — Ambulatory Visit: Payer: BC Managed Care – PPO | Admitting: Hematology and Oncology

## 2024-04-26 ENCOUNTER — Inpatient Hospital Stay: Payer: Self-pay | Admitting: Hematology and Oncology

## 2024-06-02 ENCOUNTER — Other Ambulatory Visit: Payer: Self-pay | Admitting: Hematology and Oncology
